# Patient Record
Sex: Female | Born: 1945 | Race: White | Hispanic: No | Marital: Married | Smoking: Former smoker
Health system: Southern US, Community
[De-identification: ages and names within clinical notes are randomized; demographics above are authoritative.]

## PROBLEM LIST (undated history)

## (undated) DIAGNOSIS — M1990 Unspecified osteoarthritis, unspecified site: Secondary | ICD-10-CM

## (undated) DIAGNOSIS — I10 Essential (primary) hypertension: Secondary | ICD-10-CM

## (undated) DIAGNOSIS — K219 Gastro-esophageal reflux disease without esophagitis: Secondary | ICD-10-CM

## (undated) HISTORY — DX: Essential (primary) hypertension: I10

## (undated) HISTORY — DX: Gastro-esophageal reflux disease without esophagitis: K21.9

## (undated) HISTORY — DX: Unspecified osteoarthritis, unspecified site: M19.90

---

## 2012-10-22 DIAGNOSIS — I10 Essential (primary) hypertension: Secondary | ICD-10-CM | POA: Insufficient documentation

## 2013-04-07 DIAGNOSIS — J449 Chronic obstructive pulmonary disease, unspecified: Secondary | ICD-10-CM | POA: Insufficient documentation

## 2013-10-31 DIAGNOSIS — E559 Vitamin D deficiency, unspecified: Secondary | ICD-10-CM | POA: Insufficient documentation

## 2013-11-28 DIAGNOSIS — K219 Gastro-esophageal reflux disease without esophagitis: Secondary | ICD-10-CM | POA: Diagnosis not present

## 2013-11-28 DIAGNOSIS — K921 Melena: Secondary | ICD-10-CM | POA: Diagnosis not present

## 2013-12-02 DIAGNOSIS — E785 Hyperlipidemia, unspecified: Secondary | ICD-10-CM | POA: Diagnosis not present

## 2013-12-02 DIAGNOSIS — E559 Vitamin D deficiency, unspecified: Secondary | ICD-10-CM | POA: Diagnosis not present

## 2013-12-02 DIAGNOSIS — J449 Chronic obstructive pulmonary disease, unspecified: Secondary | ICD-10-CM | POA: Diagnosis not present

## 2013-12-02 DIAGNOSIS — I10 Essential (primary) hypertension: Secondary | ICD-10-CM | POA: Diagnosis not present

## 2013-12-02 DIAGNOSIS — G4700 Insomnia, unspecified: Secondary | ICD-10-CM | POA: Diagnosis not present

## 2013-12-02 DIAGNOSIS — K219 Gastro-esophageal reflux disease without esophagitis: Secondary | ICD-10-CM | POA: Diagnosis not present

## 2014-01-24 DIAGNOSIS — R062 Wheezing: Secondary | ICD-10-CM | POA: Diagnosis not present

## 2014-01-24 DIAGNOSIS — J441 Chronic obstructive pulmonary disease with (acute) exacerbation: Secondary | ICD-10-CM | POA: Diagnosis not present

## 2014-01-24 DIAGNOSIS — G4733 Obstructive sleep apnea (adult) (pediatric): Secondary | ICD-10-CM | POA: Diagnosis not present

## 2014-01-24 DIAGNOSIS — R0602 Shortness of breath: Secondary | ICD-10-CM | POA: Diagnosis not present

## 2014-01-24 DIAGNOSIS — R05 Cough: Secondary | ICD-10-CM | POA: Diagnosis not present

## 2014-01-24 DIAGNOSIS — R942 Abnormal results of pulmonary function studies: Secondary | ICD-10-CM | POA: Diagnosis not present

## 2014-01-24 DIAGNOSIS — J309 Allergic rhinitis, unspecified: Secondary | ICD-10-CM | POA: Diagnosis not present

## 2014-01-24 DIAGNOSIS — R0789 Other chest pain: Secondary | ICD-10-CM | POA: Diagnosis not present

## 2014-01-24 DIAGNOSIS — R059 Cough, unspecified: Secondary | ICD-10-CM | POA: Diagnosis not present

## 2014-02-09 ENCOUNTER — Ambulatory Visit (INDEPENDENT_AMBULATORY_CARE_PROVIDER_SITE_OTHER): Payer: Medicare Other | Admitting: Sports Medicine

## 2014-02-09 ENCOUNTER — Encounter: Payer: Self-pay | Admitting: Sports Medicine

## 2014-02-09 ENCOUNTER — Ambulatory Visit (INDEPENDENT_AMBULATORY_CARE_PROVIDER_SITE_OTHER): Payer: Medicare Other

## 2014-02-09 VITALS — BP 131/77 | HR 69 | Ht 63.0 in | Wt 189.0 lb

## 2014-02-09 DIAGNOSIS — M1711 Unilateral primary osteoarthritis, right knee: Secondary | ICD-10-CM | POA: Insufficient documentation

## 2014-02-09 DIAGNOSIS — M25569 Pain in unspecified knee: Secondary | ICD-10-CM | POA: Diagnosis not present

## 2014-02-09 DIAGNOSIS — M25561 Pain in right knee: Secondary | ICD-10-CM

## 2014-02-09 DIAGNOSIS — M1710 Unilateral primary osteoarthritis, unspecified knee: Secondary | ICD-10-CM

## 2014-02-09 DIAGNOSIS — IMO0002 Reserved for concepts with insufficient information to code with codable children: Secondary | ICD-10-CM

## 2014-02-09 MED ORDER — MELOXICAM 15 MG PO TABS: ORAL_TABLET | ORAL | Status: DC

## 2014-02-09 NOTE — Assessment & Plan Note (Signed)
Likely related to osteoarthritis. Aspiration and injection as above. Mobic, x-rays, formal PT, return to see me in one month.

## 2014-02-09 NOTE — Progress Notes (Signed)
   Subjective:    I'm seeing this patient as a consultation for:  Dr. Jearld Lesch at [facility]  CC: Right knee pain  HPI: This is an extremely pleasant but nervous 68 year old female who comes in with a one-month history of worsening pain in her right knee, localized to both joint lines, with swelling, and instability, she also gets some popping but no catching or locking. She denies any trauma, pain came on indolently. No constitutional symptoms. Pain is severe, persistent.  Past medical history, Surgical history, Family history not pertinant except as noted below, Social history, Allergies, and medications have been entered into the medical record, reviewed, and no changes needed.   Review of Systems: No headache, visual changes, nausea, vomiting, diarrhea, constipation, dizziness, abdominal pain, skin rash, fevers, chills, night sweats, weight loss, swollen lymph nodes, body aches, joint swelling, muscle aches, chest pain, shortness of breath, mood changes, visual or auditory hallucinations.   Objective:   General: Well Developed, well nourished, and in no acute distress.  Neuro/Psych: Alert and oriented x3, extra-ocular muscles intact, able to move all 4 extremities, sensation grossly intact. Skin: Warm and dry, no rashes noted.  Respiratory: Not using accessory muscles, speaking in full sentences, trachea midline.  Cardiovascular: Pulses palpable, no extremity edema. Abdomen: Does not appear distended. Right Knee: Visible and palpable effusion with tenderness of both joint lines. ROM full in flexion and extension and lower leg rotation. Ligaments with solid consistent endpoints including ACL, PCL, LCL, MCL. Negative Mcmurray's, Apley's, and Thessalonian tests. Non painful patellar compression. Patellar glide without crepitus. Patellar and quadriceps tendons unremarkable. Hamstring and quadriceps strength is normal.   Procedure: Real-time Ultrasound Guided  aspiration Injection of right knee Device: GE Logiq E  Verbal informed consent obtained.  Time-out conducted.  Noted no overlying erythema, induration, or other signs of local infection.  Skin prepped in a sterile fashion.  Local anesthesia: Topical Ethyl chloride.  With sterile technique and under real time ultrasound guidance:  20-gauge needle advanced into suprapatellar recess, 20 cc of straw-colored fluid aspirated, syringe switched and then 2 cc Kenalog 40, 4 cc lidocaine injected easily Completed without difficulty  Pain immediately resolved suggesting accurate placement of the medication.  Advised to call if fevers/chills, erythema, induration, drainage, or persistent bleeding.  Images permanently stored and available for review in the ultrasound unit.  Impression: Technically successful ultrasound guided injection.  Impression and Recommendations:   This case required medical decision making of moderate complexity.

## 2014-03-10 DIAGNOSIS — M1710 Unilateral primary osteoarthritis, unspecified knee: Secondary | ICD-10-CM | POA: Diagnosis not present

## 2014-03-24 ENCOUNTER — Telehealth: Payer: Self-pay | Admitting: Sports Medicine

## 2014-03-24 ENCOUNTER — Encounter: Payer: Self-pay | Admitting: Sports Medicine

## 2014-03-24 NOTE — Telephone Encounter (Signed)
Patient called and request to know if she can have a letter written from you to take to court with her. She was out with her husband and the certain area was blocked off my police where you couldn't drive through which would of been too far for her to walk due to her knees and she didn't know her husband couldn't drop her off at that area which he did and then parked the car in the proper area and her husband received a $200.00 ticket just because he dropped her off in the front of the place they were attending and now has to go to court to prove why he had to drop her off. Please call if nay further questions needed in regard to the letter she needs. Patient states she will need the letter by Tuesday because she has to go to court Wednesday May 27th. Please call when her to pick up. PATIENT STATED THAT SHE HAS CALLED FOR TWO DAYS STRAIGHT AND LEFT A VOICEMAIL AND NEVER RECEIVED A CALL BACK UPSET. Thanks......[phone]

## 2014-03-24 NOTE — Telephone Encounter (Signed)
Letter is in my box 

## 2014-04-04 ENCOUNTER — Ambulatory Visit: Payer: Medicare Other | Admitting: Sports Medicine

## 2014-04-07 ENCOUNTER — Ambulatory Visit: Payer: Medicare Other | Admitting: Sports Medicine

## 2014-04-10 ENCOUNTER — Ambulatory Visit: Payer: Medicare Other | Admitting: Sports Medicine

## 2014-04-10 DIAGNOSIS — Z0289 Encounter for other administrative examinations: Secondary | ICD-10-CM

## 2014-04-23 DIAGNOSIS — R945 Abnormal results of liver function studies: Secondary | ICD-10-CM | POA: Diagnosis not present

## 2014-04-23 DIAGNOSIS — E872 Acidosis, unspecified: Secondary | ICD-10-CM | POA: Diagnosis not present

## 2014-04-23 DIAGNOSIS — A419 Sepsis, unspecified organism: Secondary | ICD-10-CM | POA: Diagnosis not present

## 2014-04-23 DIAGNOSIS — R1013 Epigastric pain: Secondary | ICD-10-CM | POA: Diagnosis not present

## 2014-04-23 DIAGNOSIS — R1084 Generalized abdominal pain: Secondary | ICD-10-CM | POA: Diagnosis not present

## 2014-04-23 DIAGNOSIS — I4460 Unspecified fascicular block: Secondary | ICD-10-CM | POA: Diagnosis not present

## 2014-04-23 DIAGNOSIS — K219 Gastro-esophageal reflux disease without esophagitis: Secondary | ICD-10-CM | POA: Diagnosis present

## 2014-04-23 DIAGNOSIS — E278 Other specified disorders of adrenal gland: Secondary | ICD-10-CM | POA: Diagnosis not present

## 2014-04-23 DIAGNOSIS — R933 Abnormal findings on diagnostic imaging of other parts of digestive tract: Secondary | ICD-10-CM | POA: Diagnosis not present

## 2014-04-23 DIAGNOSIS — G4733 Obstructive sleep apnea (adult) (pediatric): Secondary | ICD-10-CM | POA: Diagnosis not present

## 2014-04-23 DIAGNOSIS — K8309 Other cholangitis: Secondary | ICD-10-CM | POA: Diagnosis not present

## 2014-04-23 DIAGNOSIS — R059 Cough, unspecified: Secondary | ICD-10-CM | POA: Diagnosis not present

## 2014-04-23 DIAGNOSIS — R05 Cough: Secondary | ICD-10-CM | POA: Diagnosis not present

## 2014-04-23 DIAGNOSIS — D3500 Benign neoplasm of unspecified adrenal gland: Secondary | ICD-10-CM | POA: Diagnosis not present

## 2014-04-23 DIAGNOSIS — M353 Polymyalgia rheumatica: Secondary | ICD-10-CM | POA: Diagnosis present

## 2014-04-23 DIAGNOSIS — N390 Urinary tract infection, site not specified: Secondary | ICD-10-CM | POA: Diagnosis not present

## 2014-04-23 DIAGNOSIS — I4891 Unspecified atrial fibrillation: Secondary | ICD-10-CM | POA: Diagnosis not present

## 2014-04-23 DIAGNOSIS — I10 Essential (primary) hypertension: Secondary | ICD-10-CM | POA: Diagnosis not present

## 2014-04-23 DIAGNOSIS — E279 Disorder of adrenal gland, unspecified: Secondary | ICD-10-CM | POA: Diagnosis not present

## 2014-04-24 DIAGNOSIS — R945 Abnormal results of liver function studies: Secondary | ICD-10-CM | POA: Diagnosis not present

## 2014-04-25 DIAGNOSIS — R1013 Epigastric pain: Secondary | ICD-10-CM | POA: Insufficient documentation

## 2014-05-11 DIAGNOSIS — R109 Unspecified abdominal pain: Secondary | ICD-10-CM | POA: Diagnosis not present

## 2014-05-11 DIAGNOSIS — J449 Chronic obstructive pulmonary disease, unspecified: Secondary | ICD-10-CM | POA: Diagnosis not present

## 2014-05-11 DIAGNOSIS — Q441 Other congenital malformations of gallbladder: Secondary | ICD-10-CM | POA: Diagnosis not present

## 2014-05-11 DIAGNOSIS — K831 Obstruction of bile duct: Secondary | ICD-10-CM | POA: Diagnosis not present

## 2014-05-11 DIAGNOSIS — E278 Other specified disorders of adrenal gland: Secondary | ICD-10-CM | POA: Diagnosis not present

## 2014-05-11 DIAGNOSIS — I10 Essential (primary) hypertension: Secondary | ICD-10-CM | POA: Diagnosis not present

## 2014-05-11 DIAGNOSIS — K219 Gastro-esophageal reflux disease without esophagitis: Secondary | ICD-10-CM | POA: Diagnosis not present

## 2014-05-11 DIAGNOSIS — Z87891 Personal history of nicotine dependence: Secondary | ICD-10-CM | POA: Diagnosis not present

## 2014-07-03 DIAGNOSIS — L03039 Cellulitis of unspecified toe: Secondary | ICD-10-CM | POA: Diagnosis not present

## 2014-07-03 DIAGNOSIS — I10 Essential (primary) hypertension: Secondary | ICD-10-CM | POA: Diagnosis not present

## 2014-07-03 DIAGNOSIS — L600 Ingrowing nail: Secondary | ICD-10-CM | POA: Diagnosis not present

## 2014-07-21 DIAGNOSIS — L608 Other nail disorders: Secondary | ICD-10-CM | POA: Diagnosis not present

## 2014-07-21 DIAGNOSIS — L03039 Cellulitis of unspecified toe: Secondary | ICD-10-CM | POA: Diagnosis not present

## 2014-08-02 DIAGNOSIS — Z1239 Encounter for other screening for malignant neoplasm of breast: Secondary | ICD-10-CM | POA: Diagnosis not present

## 2014-08-02 DIAGNOSIS — Z79899 Other long term (current) drug therapy: Secondary | ICD-10-CM | POA: Diagnosis not present

## 2014-08-02 DIAGNOSIS — M899 Disorder of bone, unspecified: Secondary | ICD-10-CM | POA: Diagnosis not present

## 2014-08-02 DIAGNOSIS — E785 Hyperlipidemia, unspecified: Secondary | ICD-10-CM | POA: Diagnosis not present

## 2014-08-02 DIAGNOSIS — I10 Essential (primary) hypertension: Secondary | ICD-10-CM | POA: Diagnosis not present

## 2014-08-02 DIAGNOSIS — E559 Vitamin D deficiency, unspecified: Secondary | ICD-10-CM | POA: Diagnosis not present

## 2014-08-02 DIAGNOSIS — M949 Disorder of cartilage, unspecified: Secondary | ICD-10-CM | POA: Diagnosis not present

## 2014-08-02 DIAGNOSIS — Z23 Encounter for immunization: Secondary | ICD-10-CM | POA: Diagnosis not present

## 2014-08-18 DIAGNOSIS — M79609 Pain in unspecified limb: Secondary | ICD-10-CM | POA: Diagnosis not present

## 2014-08-18 DIAGNOSIS — L600 Ingrowing nail: Secondary | ICD-10-CM | POA: Diagnosis not present

## 2014-09-04 DIAGNOSIS — J441 Chronic obstructive pulmonary disease with (acute) exacerbation: Secondary | ICD-10-CM | POA: Diagnosis not present

## 2014-09-04 DIAGNOSIS — G4733 Obstructive sleep apnea (adult) (pediatric): Secondary | ICD-10-CM | POA: Diagnosis not present

## 2014-09-04 DIAGNOSIS — I4891 Unspecified atrial fibrillation: Secondary | ICD-10-CM | POA: Diagnosis not present

## 2014-09-21 DIAGNOSIS — R1013 Epigastric pain: Secondary | ICD-10-CM | POA: Diagnosis not present

## 2014-09-21 DIAGNOSIS — K319 Disease of stomach and duodenum, unspecified: Secondary | ICD-10-CM | POA: Diagnosis not present

## 2014-09-21 DIAGNOSIS — I10 Essential (primary) hypertension: Secondary | ICD-10-CM | POA: Diagnosis not present

## 2014-09-21 DIAGNOSIS — K219 Gastro-esophageal reflux disease without esophagitis: Secondary | ICD-10-CM | POA: Diagnosis not present

## 2014-09-21 DIAGNOSIS — Z87891 Personal history of nicotine dependence: Secondary | ICD-10-CM | POA: Diagnosis not present

## 2014-09-21 DIAGNOSIS — D3502 Benign neoplasm of left adrenal gland: Secondary | ICD-10-CM | POA: Diagnosis not present

## 2014-09-21 DIAGNOSIS — M353 Polymyalgia rheumatica: Secondary | ICD-10-CM | POA: Diagnosis not present

## 2014-09-21 DIAGNOSIS — J449 Chronic obstructive pulmonary disease, unspecified: Secondary | ICD-10-CM | POA: Diagnosis not present

## 2014-09-21 DIAGNOSIS — M4125 Other idiopathic scoliosis, thoracolumbar region: Secondary | ICD-10-CM | POA: Diagnosis not present

## 2014-09-25 ENCOUNTER — Ambulatory Visit (INDEPENDENT_AMBULATORY_CARE_PROVIDER_SITE_OTHER): Payer: Medicare Other | Admitting: Sports Medicine

## 2014-09-25 ENCOUNTER — Encounter: Payer: Self-pay | Admitting: Sports Medicine

## 2014-09-25 DIAGNOSIS — M1711 Unilateral primary osteoarthritis, right knee: Secondary | ICD-10-CM | POA: Diagnosis not present

## 2014-09-25 NOTE — Assessment & Plan Note (Signed)
Aspiration, steroid injection and Visco supplementation with Orthovisc. Return in one week for Orthovisc injection #2 of 4. Start formal physical therapy.

## 2014-09-25 NOTE — Progress Notes (Signed)
  Subjective:    CC: Right knee pain  HPI: This is a pleasant 68 year old female, she responded extremely well to an aspiration and injection in April of this year, she returns with recurrent swelling and pain. Moderate, persistent. Localized in the joint lines, under the patella and the suprapatellar recess. No mechanical symptoms.  Past medical history, Surgical history, Family history not pertinant except as noted below, Social history, Allergies, and medications have been entered into the medical record, reviewed, and no changes needed.   Review of Systems: No fevers, chills, night sweats, weight loss, chest pain, or shortness of breath.   Objective:    General: Well Developed, well nourished, and in no acute distress.  Neuro: Alert and oriented x3, extra-ocular muscles intact, sensation grossly intact.  HEENT: Normocephalic, atraumatic, pupils equal round reactive to light, neck supple, no masses, no lymphadenopathy, thyroid nonpalpable.  Skin: Warm and dry, no rashes. Cardiac: Regular rate and rhythm, no murmurs rubs or gallops, no lower extremity edema.  Respiratory: Clear to auscultation bilaterally. Not using accessory muscles, speaking in full sentences. Right Knee: Visible and palpable effusion with pain under the medial joint line and under the patellar facets. ROM normal in flexion and extension and lower leg rotation. Ligaments with solid consistent endpoints including ACL, PCL, LCL, MCL. Negative Mcmurray's and provocative meniscal tests. Non painful patellar compression. Patellar and quadriceps tendons unremarkable. Hamstring and quadriceps strength is normal.  Procedure: Real-time Ultrasound Guided aspiration/Injection of right knee Device: GE Logiq E  Verbal informed consent obtained.  Time-out conducted.  Noted no overlying erythema, induration, or other signs of local infection.  Skin prepped in a sterile fashion.  Local anesthesia: Topical Ethyl chloride.  With  sterile technique and under real time ultrasound guidance:  20 mL of straw-colored fluid aspirated, syringe switched and 2 mL kenalog 40, 4 mL lidocaine injected, syringe again switched and  30 mg/2 mL of OrthoVisc (sodium hyaluronate) in a prefilled syringe was injected easily into the knee through a 18-gauge needle. Completed without difficulty  Pain immediately resolved suggesting accurate placement of the medication.  Advised to call if fevers/chills, erythema, induration, drainage, or persistent bleeding.  Images permanently stored and available for review in the ultrasound unit.  Impression: Technically successful ultrasound guided injection.  Impression and Recommendations:

## 2014-10-03 ENCOUNTER — Telehealth: Payer: Self-pay

## 2014-10-04 ENCOUNTER — Ambulatory Visit: Payer: Medicare Other | Admitting: Physical Therapy

## 2014-10-04 NOTE — Telephone Encounter (Signed)
Error. Nyonna Hargrove,CMA  

## 2014-10-05 ENCOUNTER — Ambulatory Visit (INDEPENDENT_AMBULATORY_CARE_PROVIDER_SITE_OTHER): Payer: Medicare Other | Admitting: Sports Medicine

## 2014-10-05 ENCOUNTER — Encounter: Payer: Self-pay | Admitting: Sports Medicine

## 2014-10-05 VITALS — BP 190/100 | HR 83 | Wt 184.0 lb

## 2014-10-05 DIAGNOSIS — M1711 Unilateral primary osteoarthritis, right knee: Secondary | ICD-10-CM | POA: Diagnosis not present

## 2014-10-05 MED ORDER — ACETAMINOPHEN-CODEINE #4 300-60 MG PO TABS: 1.0000 | ORAL_TABLET | Freq: Three times a day (TID) | ORAL | Status: DC | PRN

## 2014-10-05 NOTE — Assessment & Plan Note (Addendum)
Steroid flare after the first injection. Orthovisc injection #2 of 4. Return in one week for #3 of 4. Tylenol No. 4 as needed for pain Return in a separate visit for orthotics.

## 2014-10-05 NOTE — Progress Notes (Signed)
  Procedure: Real-time Ultrasound Guided Injection of Right knee  Device: GE Logiq E  Verbal informed consent obtained.  Time-out conducted.  Noted no overlying erythema, induration, or other signs of local infection.  Skin prepped in a sterile fashion.  Local anesthesia: Topical Ethyl chloride.  With sterile technique and under real time ultrasound guidance: 20 mL straw-colored fluid aspirated, syringe switched and  30 mg/2 mL of OrthoVisc (sodium hyaluronate) in a prefilled syringe was injected easily into the knee through a 22-gauge needle. Completed without difficulty  Pain immediately resolved suggesting accurate placement of the medication.  Advised to call if fevers/chills, erythema, induration, drainage, or persistent bleeding.  Images permanently stored and available for review in the ultrasound unit.  Impression: Technically successful ultrasound guided injection.

## 2014-10-06 ENCOUNTER — Ambulatory Visit: Payer: Medicare Other | Admitting: Sports Medicine

## 2014-10-12 ENCOUNTER — Ambulatory Visit (INDEPENDENT_AMBULATORY_CARE_PROVIDER_SITE_OTHER): Payer: Medicare Other | Admitting: Sports Medicine

## 2014-10-12 ENCOUNTER — Telehealth: Payer: Self-pay | Admitting: Sports Medicine

## 2014-10-12 ENCOUNTER — Encounter: Payer: Self-pay | Admitting: Sports Medicine

## 2014-10-12 VITALS — BP 156/84 | HR 81 | Ht 63.0 in | Wt 184.0 lb

## 2014-10-12 DIAGNOSIS — M1711 Unilateral primary osteoarthritis, right knee: Secondary | ICD-10-CM | POA: Diagnosis not present

## 2014-10-12 MED ORDER — ACETAMINOPHEN-CODEINE #4 300-60 MG PO TABS: 1.0000 | ORAL_TABLET | Freq: Three times a day (TID) | ORAL | Status: DC | PRN

## 2014-10-12 MED ORDER — AMBULATORY NON FORMULARY MEDICATION: Status: DC

## 2014-10-12 NOTE — Telephone Encounter (Signed)
Re: Orthotics-no appts before Jan 15th Pt's husband said that Doctor told them that he  can refer his wife  to someone in [facility]  if you did not have any appts sooner. Thank you.

## 2014-10-12 NOTE — Telephone Encounter (Signed)
I would recommend Dr. Karlton Lemon for the orthotics.  He builds them the same way as I do, and if they cannot wait for a slot to open up for me for that long please see if he would be willing to make them.  I will cc him on this.  Dr. Barbaraann Barthel this is a pleasant patient that I am treating for knee OA, currently going thru visco.  I think she would benefit from orthotics.  Wondering if you could fit her in before I can?  I mentioned you to her in the office today.

## 2014-10-12 NOTE — Progress Notes (Signed)
  Procedure: Real-time Ultrasound Guided Injection of Right knee  Device: GE Logiq E  Verbal informed consent obtained.  Time-out conducted.  Noted no overlying erythema, induration, or other signs of local infection.  Skin prepped in a sterile fashion.  Local anesthesia: Topical Ethyl chloride.  With sterile technique and under real time ultrasound guidance: 10 mL straw-colored fluid aspirated, syringe switched and  30 mg/2 mL of OrthoVisc (sodium hyaluronate) in a prefilled syringe was injected easily into the knee through a 22-gauge needle. Completed without difficulty  Pain immediately resolved suggesting accurate placement of the medication.  Advised to call if fevers/chills, erythema, induration, drainage, or persistent bleeding.  Images permanently stored and available for review in the ultrasound unit.  Impression: Technically successful ultrasound guided injection.  I then took a great deal of time strapping on a compressive dressing and a knee brace to her right knee.

## 2014-10-12 NOTE — Assessment & Plan Note (Addendum)
Orthovisc injection #3 of 4 given today. Reaction knee brace given today. Prescription for walker and reprinting pain medication, patient says he lost the prescription, understands that we will not be providing any further refills of lost controlled substances.Marland Kitchen

## 2014-10-13 NOTE — Telephone Encounter (Signed)
Sounds good - Will forward to my staff to contact her for an appointment.  Thanks!

## 2014-10-13 NOTE — Telephone Encounter (Signed)
Patient on the schedule for Tuesday.

## 2014-10-17 ENCOUNTER — Ambulatory Visit (INDEPENDENT_AMBULATORY_CARE_PROVIDER_SITE_OTHER): Payer: Medicare Other | Admitting: Family Medicine

## 2014-10-17 VITALS — BP 130/83 | HR 83 | Ht 63.0 in | Wt 180.0 lb

## 2014-10-17 DIAGNOSIS — M1711 Unilateral primary osteoarthritis, right knee: Secondary | ICD-10-CM | POA: Diagnosis not present

## 2014-10-17 DIAGNOSIS — R269 Unspecified abnormalities of gait and mobility: Secondary | ICD-10-CM

## 2014-10-19 ENCOUNTER — Ambulatory Visit (INDEPENDENT_AMBULATORY_CARE_PROVIDER_SITE_OTHER): Payer: Medicare Other | Admitting: Sports Medicine

## 2014-10-19 ENCOUNTER — Encounter: Payer: Self-pay | Admitting: Sports Medicine

## 2014-10-19 ENCOUNTER — Ambulatory Visit: Payer: Medicare Other | Admitting: Sports Medicine

## 2014-10-19 DIAGNOSIS — R269 Unspecified abnormalities of gait and mobility: Secondary | ICD-10-CM | POA: Insufficient documentation

## 2014-10-19 DIAGNOSIS — I4891 Unspecified atrial fibrillation: Secondary | ICD-10-CM | POA: Insufficient documentation

## 2014-10-19 DIAGNOSIS — E785 Hyperlipidemia, unspecified: Secondary | ICD-10-CM | POA: Insufficient documentation

## 2014-10-19 DIAGNOSIS — K219 Gastro-esophageal reflux disease without esophagitis: Secondary | ICD-10-CM | POA: Insufficient documentation

## 2014-10-19 DIAGNOSIS — M1711 Unilateral primary osteoarthritis, right knee: Secondary | ICD-10-CM | POA: Diagnosis not present

## 2014-10-19 DIAGNOSIS — G4733 Obstructive sleep apnea (adult) (pediatric): Secondary | ICD-10-CM | POA: Insufficient documentation

## 2014-10-19 MED ORDER — DICLOFENAC SODIUM 2 % TD SOLN: 2.0000 | Freq: Two times a day (BID) | TRANSDERMAL | Status: DC

## 2014-10-19 MED ORDER — DICLOFENAC SODIUM 2 % TD SOLN: TRANSDERMAL | Status: DC

## 2014-10-19 NOTE — Addendum Note (Signed)
Addended by: Doree Albee on: 10/19/2014 04:32 PM   Modules accepted: Orders

## 2014-10-19 NOTE — Assessment & Plan Note (Signed)
Orthovisc injection #4 of 4 today. Continue Tylenol No. 4. Continue knee brace. She is now a candidate for surgical intervention, she would like to think about it over the next month. I am going to add Pennsaid topical.

## 2014-10-19 NOTE — Addendum Note (Signed)
Addended by: Doree Albee on: 10/19/2014 04:37 PM   Modules accepted: Orders, Medications

## 2014-10-19 NOTE — Progress Notes (Signed)
  Procedure: Real-time Ultrasound Guided Injection of Right knee  Device: GE Logiq E  Verbal informed consent obtained.  Time-out conducted.  Noted no overlying erythema, induration, or other signs of local infection.  Skin prepped in a sterile fashion.  Local anesthesia: Topical Ethyl chloride.  With sterile technique and under real time ultrasound guidance: 10 mL straw-colored fluid aspirated, syringe switched and  30 mg/2 mL of OrthoVisc (sodium hyaluronate) in a prefilled syringe was injected easily into the knee through a 22-gauge needle. Completed without difficulty  Pain immediately resolved suggesting accurate placement of the medication.  Advised to call if fevers/chills, erythema, induration, drainage, or persistent bleeding.  Images permanently stored and available for review in the ultrasound unit.  Impression: Technically successful ultrasound guided injection.

## 2014-10-19 NOTE — Progress Notes (Signed)
PCP: Jearld Lesch, DO  Subjective:   HPI: Patient is a 68 y.o. female here for custom orthotics.  Patient has been seeing Dr. Dianah Field at [facility] for her right knee osteoarthritis. Treatment has included cortisone injection, orthovisc series, knee brace, exercises, mobic. Orthotics recommended for additional cushion and arch support.  Past Medical History  Diagnosis Date  . Hypertension   . GERD (gastroesophageal reflux disease)   . Arthritis     Current Outpatient Prescriptions on File Prior to Visit  Medication Sig Dispense Refill  . acetaminophen-codeine (TYLENOL #4) 300-60 MG per tablet Take 1 tablet by mouth every 8 (eight) hours as needed for moderate pain. 30 tablet 0  . AMBULATORY NON FORMULARY MEDICATION Four point walker 1 each 0  . atenolol (TENORMIN) 25 MG tablet Take 25 mg by mouth daily.    . hydrochlorothiazide (HYDRODIURIL) 25 MG tablet Take 25 mg by mouth daily.    . meloxicam (MOBIC) 15 MG tablet One tab PO qAM with breakfast for 2 weeks, then daily prn pain. 30 tablet 3  . pantoprazole (PROTONIX) 40 MG tablet Take 40 mg by mouth daily.     No current facility-administered medications on file prior to visit.    No past surgical history on file.  Allergies  Allergen Reactions  . Ace Inhibitors     Other reaction(s): Cough  . Amlodipine Cough    History   Social History  . Marital Status: Married    Spouse Name: N/A    Number of Children: N/A  . Years of Education: N/A   Occupational History  . Not on file.   Social History Main Topics  . Smoking status: Unknown If Ever Smoked  . Smokeless tobacco: Not on file  . Alcohol Use: Not on file  . Drug Use: Not on file  . Sexual Activity: Not on file   Other Topics Concern  . Not on file   Social History Narrative    No family history on file.  BP 130/83 mmHg  Pulse 83  Ht 5\' 3"  (1.6 m)  Wt 180 lb (81.647 kg)  BMI 31.89 kg/m2  Review of Systems: See HPI above.    Objective:   Physical Exam:  Gen: NAD  Bilateral feet/ankles: Mod overpronation.  No forefoot callus formation. No hallux valgus or rigidus. No TTP either feet or ankles. No leg length inequality.    Assessment & Plan:  1. Right knee arthritis - continue treatment with Dr. Dianah Field.  Custom orthotics made today as well - she did not have shoes to fit these into however and plans to bring these with her to buy new shoes.  Call us for reevaluation if any issues with them. Patient was fitted for a : standard, cushioned, semi-rigid orthotic. The orthotic was heated and afterward the patient stood on the orthotic blank positioned on the orthotic stand. The patient was positioned in subtalar neutral position and 10 degrees of ankle dorsiflexion in a weight bearing stance. After completion of molding, a stable base was applied to the orthotic blank. The blank was ground to a stable position for weight bearing. Size: 8 Base: blue med density eva Posting: none Additional orthotic padding: none Total prep time 45 minutes.

## 2014-10-19 NOTE — Assessment & Plan Note (Signed)
continue treatment with Dr. Dianah Field.  Custom orthotics made today as well - she did not have shoes to fit these into however and plans to bring these with her to buy new shoes.  Call us for reevaluation if any issues with them. Patient was fitted for a : standard, cushioned, semi-rigid orthotic. The orthotic was heated and afterward the patient stood on the orthotic blank positioned on the orthotic stand. The patient was positioned in subtalar neutral position and 10 degrees of ankle dorsiflexion in a weight bearing stance. After completion of molding, a stable base was applied to the orthotic blank. The blank was ground to a stable position for weight bearing. Size: 8 Base: blue med density eva Posting: none Additional orthotic padding: none Total prep time 45 minutes.

## 2014-10-25 DIAGNOSIS — R945 Abnormal results of liver function studies: Secondary | ICD-10-CM | POA: Diagnosis not present

## 2014-10-25 DIAGNOSIS — M353 Polymyalgia rheumatica: Secondary | ICD-10-CM | POA: Diagnosis not present

## 2014-10-25 DIAGNOSIS — K831 Obstruction of bile duct: Secondary | ICD-10-CM | POA: Diagnosis not present

## 2014-10-25 DIAGNOSIS — J449 Chronic obstructive pulmonary disease, unspecified: Secondary | ICD-10-CM | POA: Diagnosis not present

## 2014-10-25 DIAGNOSIS — K219 Gastro-esophageal reflux disease without esophagitis: Secondary | ICD-10-CM | POA: Diagnosis not present

## 2014-10-25 DIAGNOSIS — I10 Essential (primary) hypertension: Secondary | ICD-10-CM | POA: Diagnosis not present

## 2014-10-25 DIAGNOSIS — Z87891 Personal history of nicotine dependence: Secondary | ICD-10-CM | POA: Diagnosis not present

## 2014-10-25 DIAGNOSIS — R932 Abnormal findings on diagnostic imaging of liver and biliary tract: Secondary | ICD-10-CM | POA: Diagnosis not present

## 2014-10-25 DIAGNOSIS — K839 Disease of biliary tract, unspecified: Secondary | ICD-10-CM | POA: Diagnosis not present

## 2014-10-25 DIAGNOSIS — K838 Other specified diseases of biliary tract: Secondary | ICD-10-CM | POA: Diagnosis not present

## 2014-11-15 ENCOUNTER — Other Ambulatory Visit: Payer: Self-pay | Admitting: Sports Medicine

## 2014-11-15 ENCOUNTER — Encounter: Payer: Medicare Other | Admitting: Sports Medicine

## 2014-11-21 DIAGNOSIS — M79604 Pain in right leg: Secondary | ICD-10-CM | POA: Diagnosis not present

## 2014-11-21 DIAGNOSIS — R0789 Other chest pain: Secondary | ICD-10-CM | POA: Diagnosis not present

## 2014-11-21 DIAGNOSIS — G4733 Obstructive sleep apnea (adult) (pediatric): Secondary | ICD-10-CM | POA: Diagnosis not present

## 2014-11-21 DIAGNOSIS — J9811 Atelectasis: Secondary | ICD-10-CM | POA: Diagnosis not present

## 2014-11-21 DIAGNOSIS — I8001 Phlebitis and thrombophlebitis of superficial vessels of right lower extremity: Secondary | ICD-10-CM | POA: Diagnosis not present

## 2014-11-21 DIAGNOSIS — R079 Chest pain, unspecified: Secondary | ICD-10-CM | POA: Diagnosis not present

## 2014-11-21 DIAGNOSIS — M25561 Pain in right knee: Secondary | ICD-10-CM | POA: Diagnosis not present

## 2014-11-22 DIAGNOSIS — J449 Chronic obstructive pulmonary disease, unspecified: Secondary | ICD-10-CM | POA: Diagnosis not present

## 2014-11-22 DIAGNOSIS — G4733 Obstructive sleep apnea (adult) (pediatric): Secondary | ICD-10-CM | POA: Diagnosis not present

## 2014-11-22 DIAGNOSIS — J9811 Atelectasis: Secondary | ICD-10-CM | POA: Diagnosis not present

## 2014-11-27 ENCOUNTER — Telehealth: Payer: Self-pay | Admitting: Emergency Medicine

## 2014-11-27 DIAGNOSIS — M1710 Unilateral primary osteoarthritis, unspecified knee: Secondary | ICD-10-CM

## 2014-11-27 NOTE — Telephone Encounter (Signed)
Patient called to say she wants to go to surgeon, as you suggested last visit 10-20-15; she has selected Dr.Donald Albright in [place]; [phone]; would prefer an afternoon appointment.

## 2014-11-27 NOTE — Telephone Encounter (Signed)
Referral placed.

## 2014-11-28 ENCOUNTER — Telehealth: Payer: Self-pay

## 2014-11-28 NOTE — Telephone Encounter (Signed)
Patient called in regards to her referral for Dr. Riki Sheer. The order is already in please call patient with update. [name],CMA

## 2014-12-19 DIAGNOSIS — E669 Obesity, unspecified: Secondary | ICD-10-CM | POA: Diagnosis not present

## 2014-12-19 DIAGNOSIS — M170 Bilateral primary osteoarthritis of knee: Secondary | ICD-10-CM | POA: Diagnosis not present

## 2014-12-21 DIAGNOSIS — M25561 Pain in right knee: Secondary | ICD-10-CM | POA: Diagnosis not present

## 2014-12-21 DIAGNOSIS — R260 Ataxic gait: Secondary | ICD-10-CM | POA: Diagnosis not present

## 2014-12-22 DIAGNOSIS — R260 Ataxic gait: Secondary | ICD-10-CM | POA: Diagnosis not present

## 2014-12-22 DIAGNOSIS — M25561 Pain in right knee: Secondary | ICD-10-CM | POA: Diagnosis not present

## 2014-12-25 DIAGNOSIS — M25561 Pain in right knee: Secondary | ICD-10-CM | POA: Diagnosis not present

## 2014-12-25 DIAGNOSIS — R260 Ataxic gait: Secondary | ICD-10-CM | POA: Diagnosis not present

## 2015-01-03 DIAGNOSIS — M25561 Pain in right knee: Secondary | ICD-10-CM | POA: Diagnosis not present

## 2015-01-03 DIAGNOSIS — R260 Ataxic gait: Secondary | ICD-10-CM | POA: Diagnosis not present

## 2015-01-05 DIAGNOSIS — R260 Ataxic gait: Secondary | ICD-10-CM | POA: Diagnosis not present

## 2015-01-05 DIAGNOSIS — M25561 Pain in right knee: Secondary | ICD-10-CM | POA: Diagnosis not present

## 2015-01-08 DIAGNOSIS — R260 Ataxic gait: Secondary | ICD-10-CM | POA: Diagnosis not present

## 2015-01-08 DIAGNOSIS — M25561 Pain in right knee: Secondary | ICD-10-CM | POA: Diagnosis not present

## 2015-01-10 DIAGNOSIS — M25561 Pain in right knee: Secondary | ICD-10-CM | POA: Diagnosis not present

## 2015-01-10 DIAGNOSIS — R260 Ataxic gait: Secondary | ICD-10-CM | POA: Diagnosis not present

## 2015-01-16 DIAGNOSIS — R260 Ataxic gait: Secondary | ICD-10-CM | POA: Diagnosis not present

## 2015-01-16 DIAGNOSIS — M25561 Pain in right knee: Secondary | ICD-10-CM | POA: Diagnosis not present

## 2015-01-17 DIAGNOSIS — M25561 Pain in right knee: Secondary | ICD-10-CM | POA: Diagnosis not present

## 2015-01-17 DIAGNOSIS — R260 Ataxic gait: Secondary | ICD-10-CM | POA: Diagnosis not present

## 2015-01-19 DIAGNOSIS — M25561 Pain in right knee: Secondary | ICD-10-CM | POA: Diagnosis not present

## 2015-01-19 DIAGNOSIS — R260 Ataxic gait: Secondary | ICD-10-CM | POA: Diagnosis not present

## 2015-01-23 DIAGNOSIS — M7121 Synovial cyst of popliteal space [Baker], right knee: Secondary | ICD-10-CM | POA: Diagnosis not present

## 2015-01-23 DIAGNOSIS — M25461 Effusion, right knee: Secondary | ICD-10-CM | POA: Diagnosis not present

## 2015-01-23 DIAGNOSIS — S83271A Complex tear of lateral meniscus, current injury, right knee, initial encounter: Secondary | ICD-10-CM | POA: Diagnosis not present

## 2015-01-30 DIAGNOSIS — M170 Bilateral primary osteoarthritis of knee: Secondary | ICD-10-CM | POA: Diagnosis not present

## 2015-02-12 DIAGNOSIS — J441 Chronic obstructive pulmonary disease with (acute) exacerbation: Secondary | ICD-10-CM | POA: Diagnosis not present

## 2015-02-12 DIAGNOSIS — R05 Cough: Secondary | ICD-10-CM | POA: Diagnosis not present

## 2015-02-12 DIAGNOSIS — G4733 Obstructive sleep apnea (adult) (pediatric): Secondary | ICD-10-CM | POA: Diagnosis not present

## 2015-05-23 DIAGNOSIS — M5136 Other intervertebral disc degeneration, lumbar region: Secondary | ICD-10-CM | POA: Diagnosis not present

## 2015-05-23 DIAGNOSIS — M545 Low back pain: Secondary | ICD-10-CM | POA: Diagnosis not present

## 2015-05-23 DIAGNOSIS — L03019 Cellulitis of unspecified finger: Secondary | ICD-10-CM | POA: Diagnosis not present

## 2015-06-04 DIAGNOSIS — H2510 Age-related nuclear cataract, unspecified eye: Secondary | ICD-10-CM | POA: Diagnosis not present

## 2015-06-04 DIAGNOSIS — H5200 Hypermetropia, unspecified eye: Secondary | ICD-10-CM | POA: Diagnosis not present

## 2015-06-04 DIAGNOSIS — H11153 Pinguecula, bilateral: Secondary | ICD-10-CM | POA: Diagnosis not present

## 2015-06-04 DIAGNOSIS — H35033 Hypertensive retinopathy, bilateral: Secondary | ICD-10-CM | POA: Diagnosis not present

## 2015-06-04 DIAGNOSIS — H52223 Regular astigmatism, bilateral: Secondary | ICD-10-CM | POA: Diagnosis not present

## 2015-06-04 DIAGNOSIS — H524 Presbyopia: Secondary | ICD-10-CM | POA: Diagnosis not present

## 2015-06-16 IMAGING — CR DG KNEE 1-2V*L*
4 series · 4 of 4 positions shown · non-contrast
Comparison: None.

CLINICAL DATA: Pain.

EXAM:
LEFT KNEE - 1-2 VIEW

[view not recorded (1 of 4)]
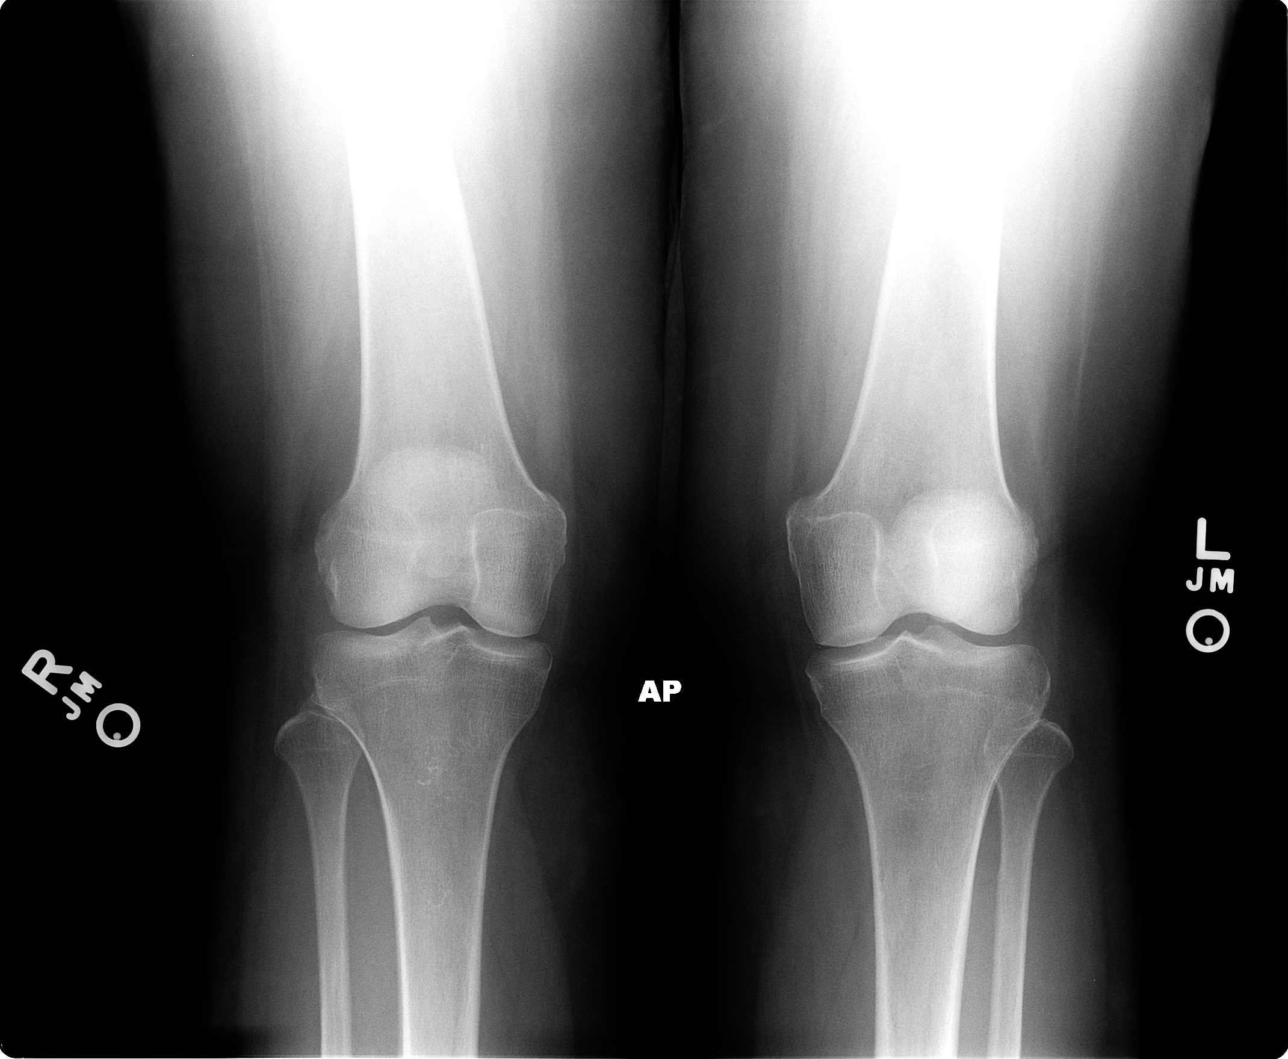

[view not recorded (2 of 4)]
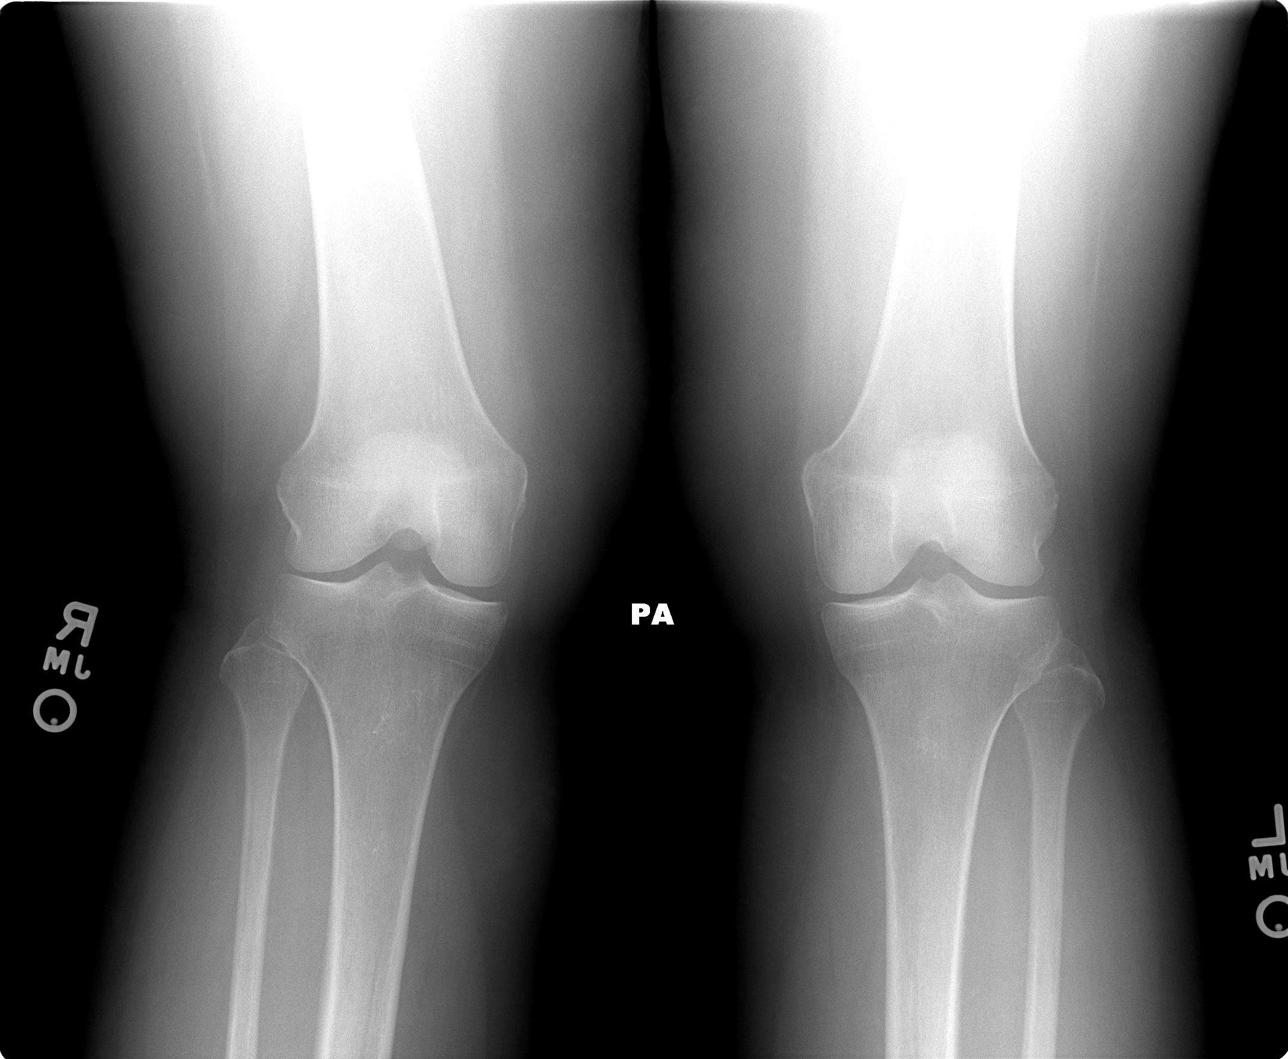

[view not recorded (3 of 4)]
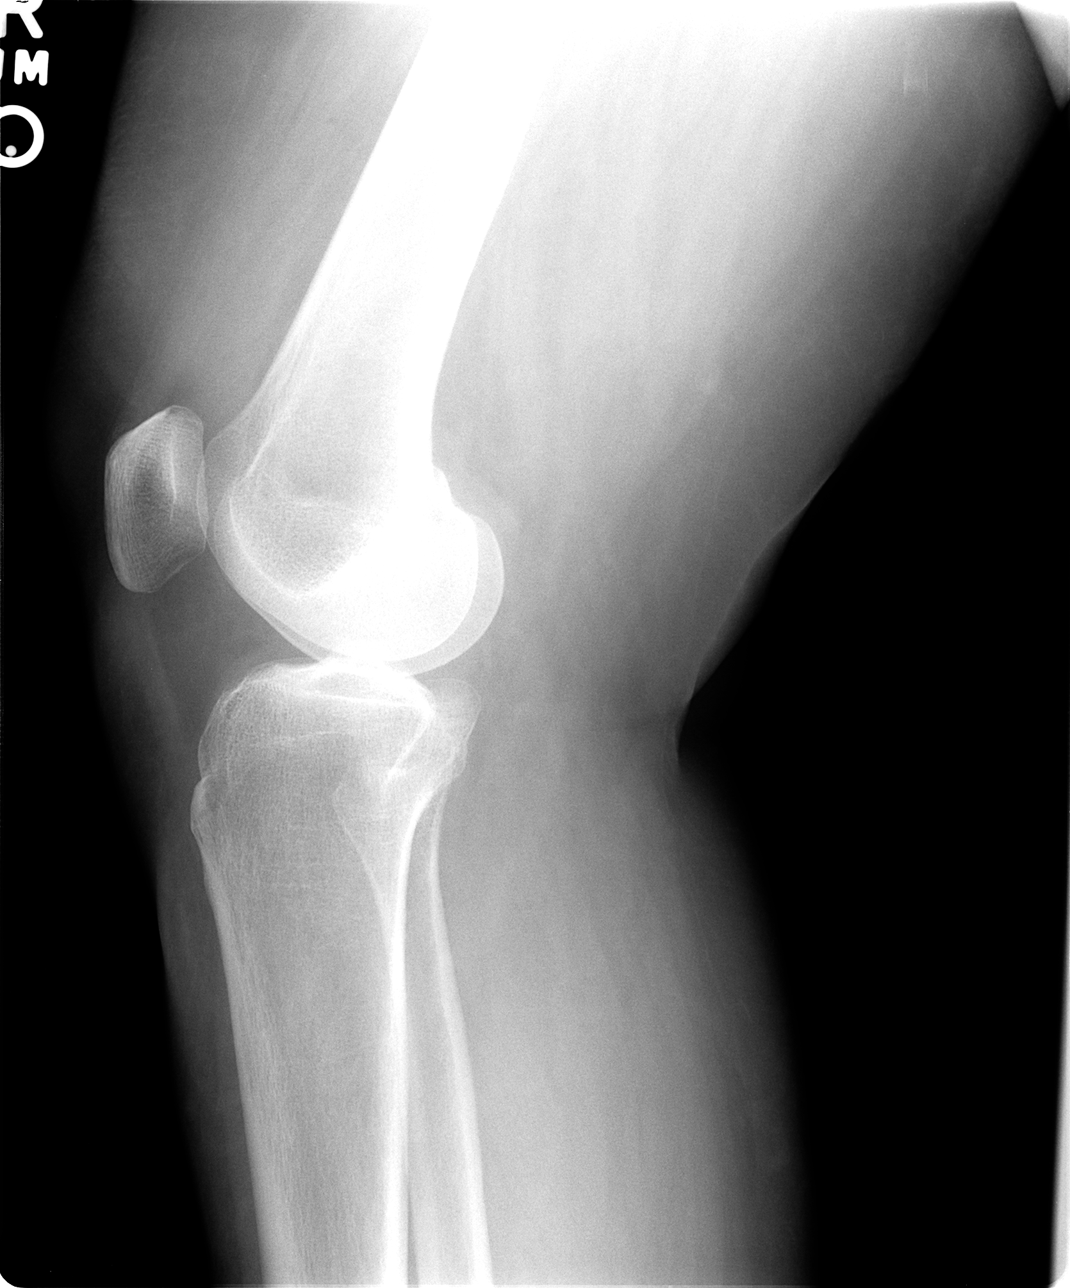

[view not recorded (4 of 4)]
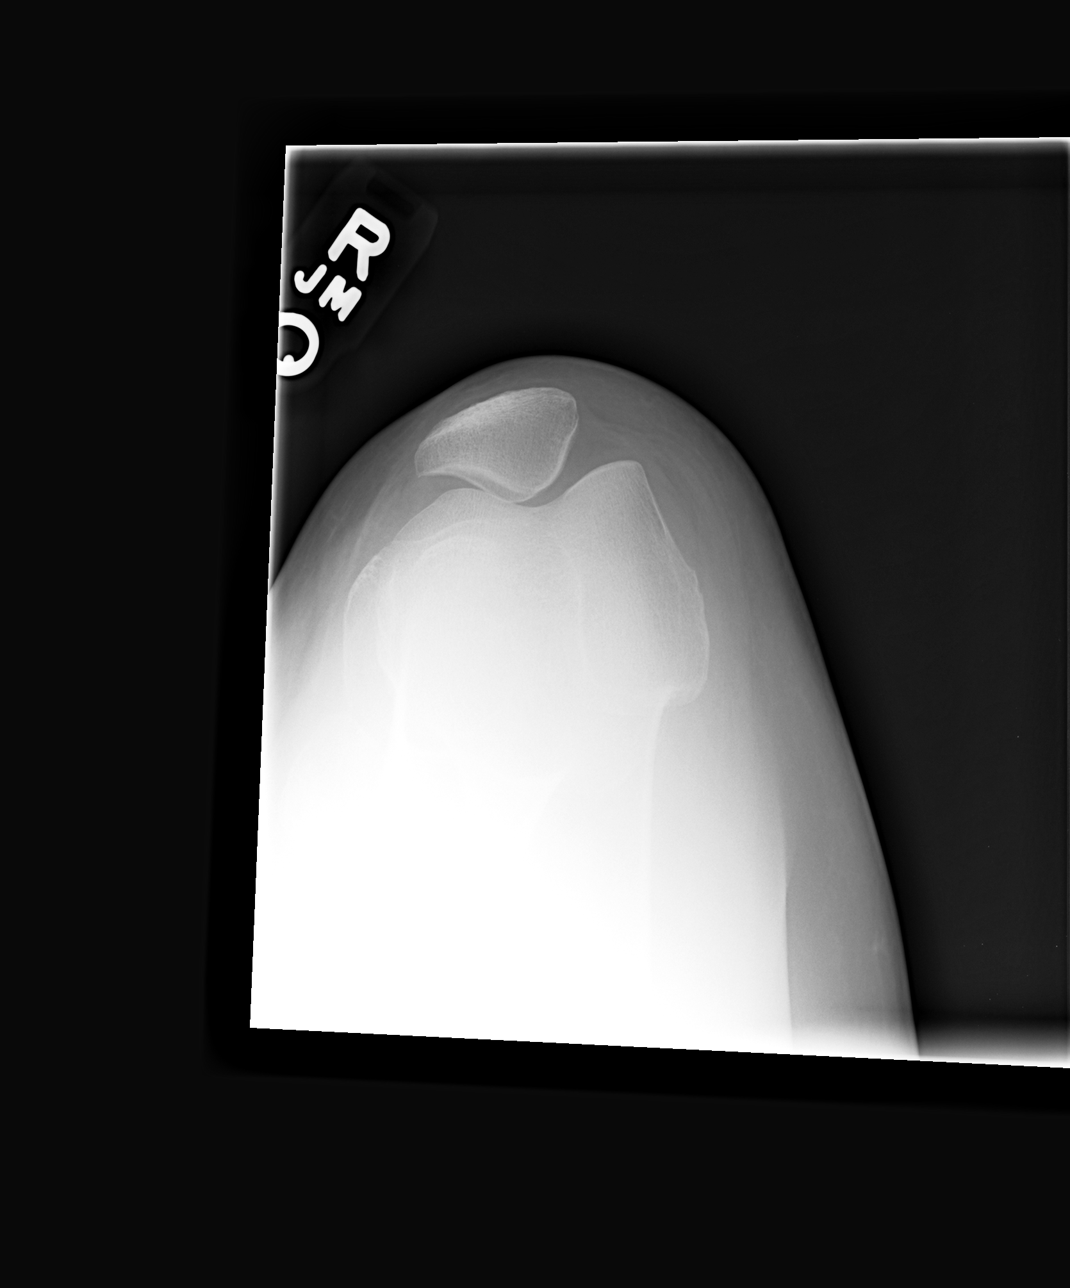

[4 of 4 positions shown; findings below may reference images not displayed]

FINDINGS: There is no evidence of fracture, dislocation, or joint effusion.
Mild degenerative changes present. Joint space loss is most
prominent about the lateral compartment. There is no evidence of
erosive arthropathy or other focal bone abnormality. Soft tissues
are unremarkable.
IMPRESSION: Mild degenerative change with joint space loss of the lateral
compartment. No acute abnormality.

## 2015-07-07 ENCOUNTER — Encounter: Payer: Self-pay | Admitting: Emergency Medicine

## 2015-07-07 ENCOUNTER — Emergency Department (INDEPENDENT_AMBULATORY_CARE_PROVIDER_SITE_OTHER)
Admission: EM | Admit: 2015-07-07 | Discharge: 2015-07-07 | Disposition: A | Discharge status: Home / Self Care | Payer: Medicare Other | Source: Home / Self Care | Attending: Family Medicine | Admitting: Family Medicine

## 2015-07-07 DIAGNOSIS — H109 Unspecified conjunctivitis: Secondary | ICD-10-CM

## 2015-07-07 MED ORDER — TOBRAMYCIN 0.3 % OP SOLN: 1.0000 [drp] | OPHTHALMIC | Status: DC

## 2015-07-07 MED ORDER — CARBOXYMETHYLCELLULOSE SODIUM 1 % OP SOLN: 1.0000 [drp] | Freq: Three times a day (TID) | OPHTHALMIC | Status: DC

## 2015-07-07 NOTE — ED Notes (Signed)
Called pt rx to pharm per her request.

## 2015-07-07 NOTE — Discharge Instructions (Signed)
Conjunctivitis Conjunctivitis is commonly called "pink eye." Conjunctivitis can be caused by bacterial or viral infection, allergies, or injuries. There is usually redness of the lining of the eye, itching, discomfort, and sometimes discharge. There may be deposits of matter along the eyelids. A viral infection usually causes a watery discharge, while a bacterial infection causes a yellowish, thick discharge. Pink eye is very contagious and spreads by direct contact. You may be given antibiotic eyedrops as part of your treatment. Before using your eye medicine, remove all drainage from the eye by washing gently with warm water and cotton balls. Continue to use the medication until you have awakened 2 mornings in a row without discharge from the eye. Do not rub your eye. This increases the irritation and helps spread infection. Use separate towels from other household members. Wash your hands with soap and water before and after touching your eyes. Use cold compresses to reduce pain and sunglasses to relieve irritation from light. Do not wear contact lenses or wear eye makeup until the infection is gone. SEEK MEDICAL CARE IF:   Your symptoms are not better after 3 days of treatment.  You have increased pain or trouble seeing.  The outer eyelids become very red or swollen. Document Released: 11/27/2004 Document Revised: 01/12/2012 Document Reviewed: 10/20/2005 [facility] Patient Information 2015 [place], [place]. This information is not intended to replace advice given to you by your health care provider. Make sure you discuss any questions you have with your health care provider.  Blepharitis Blepharitis is a skin problem that makes your eyelids watery, red, puffy (swollen), crusty, scaly, or painful. It may also make your eyes itch. You may lose eyelashes. HOME CARE  Keep your hands clean.  Use a clean towel each time you dry your eyelids. Do not share towels or makeup with anyone.  Carefully wash  your eyelids and eyelashes 2 times a day. Use warm water and baby shampoo or just water.  Wash your face and eyebrows at least once a day.  Hold a folded washcloth under warm water. Squeeze the water out. Put the warm washcloth on your eyes 2 times a day for 10 minutes, or as told by your doctor.  Apply medicated cream as told by your doctor.  Avoid rubbing your eyes.  Avoid wearing makeup until you get better.  Follow up with your doctor as told. GET HELP RIGHT AWAY IF:  Your pain, redness, or puffiness gets worse.  Your pain, redness, or puffiness spreads to other parts of your face.  Your vision changes, or you have pain when looking at lights or moving objects.  You have a fever.  You do not get better after 2 to 4 days. MAKE SURE YOU:  Understand these instructions.  Will watch your condition.  Will get help right away if you are not doing well or get worse. Document Released: 07/29/2008 Document Revised: 01/12/2012 Document Reviewed: 11/27/2010 [facility] Patient Information 2015 [place], [place]. This information is not intended to replace advice given to you by your health care provider. Make sure you discuss any questions you have with your health care provider.

## 2015-07-07 NOTE — ED Notes (Signed)
Pt c/o right eye itching and drainage x6 days.

## 2015-07-07 NOTE — ED Provider Notes (Signed)
CSN: [id number]     Arrival date & time 07/07/15  83 History   First MD Initiated Contact with Patient 07/07/15 1638     Chief Complaint  Patient presents with  . Eye Problem   (Consider location/radiation/quality/duration/timing/severity/associated sxs/prior Treatment) HPI Pt is a 69yo female presenting to [facility] with c/o 6 day hx of Right eye erythema, itching and yellow discharge. Pain is 1/10 described as a "discomfort" denies recent illness of cough, congestion, or sore throat. Denies sick contacts or contact with new soaps, lotions or medications. 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 Denies trauma to eye. Denies fever or chills.  Past Medical History  Diagnosis Date  . Hypertension   . GERD (gastroesophageal reflux disease)   . Arthritis    History reviewed. No pertinent past surgical history. History reviewed. No pertinent family history. Social History  Substance Use Topics  . Smoking status: Never Smoker   . Smokeless tobacco: None  . Alcohol Use: No   OB History    No data available     Review of Systems  Constitutional: Negative for fever and chills.  HENT: Negative for congestion and ear pain.   Eyes: Positive for pain, discharge, redness and itching. Negative for photophobia and visual disturbance.  Respiratory: Negative for cough.   Neurological: Negative for dizziness, light-headedness and headaches.    Allergies  Ace inhibitors and Amlodipine  Home Medications   Prior to Admission medications   Medication Sig Start Date End Date Taking? Authorizing Provider  acetaminophen-codeine (TYLENOL #4) 300-60 MG per tablet TAKE ONE TABLET BY MOUTH EVERY 8 HOURS AS NEEDED FOR MODERATE PAIN 11/15/14   Silverio Decamp, MD  AMBULATORY NON FORMULARY MEDICATION Four point walker 10/12/14   Silverio Decamp, MD  atenolol (TENORMIN) 25 MG tablet Take 25 mg by mouth daily.    Historical Provider, MD   carboxymethylcellulose 1 % ophthalmic solution Apply 1 drop to eye 3 ([facility]) times daily. For 7 days, keep refrigerated 07/07/15   Noland Fordyce, PA-C  Diclofenac Sodium 2 % SOLN Place 2 sprays onto the skin 2 (two) times daily. 10/19/14   Silverio Decamp, MD  hydrochlorothiazide (HYDRODIURIL) 25 MG tablet Take 25 mg by mouth daily.    Historical Provider, MD  meloxicam (MOBIC) 15 MG tablet One tab PO qAM with breakfast for 2 weeks, then daily prn pain. 02/09/14   Silverio Decamp, MD  pantoprazole (PROTONIX) 40 MG tablet Take 40 mg by mouth daily.    Historical Provider, MD  tobramycin (TOBREX) 0.3 % ophthalmic solution Place 1 drop into the right eye every 4 (four) hours. For 5 days 07/07/15   Noland Fordyce, PA-C   Meds Ordered and Administered this Visit  Medications - No data to display  BP 127/81 mmHg  Pulse 78  Temp(Src) 98.8 F (37.1 C) (Oral)  SpO2 96% No data found.   Physical Exam  Constitutional: She is oriented to person, place, and time. She appears well-developed and well-nourished.  HENT:  Head: Normocephalic and atraumatic.  Nose: Nose normal.  Mouth/Throat: Uvula is midline, oropharynx is clear and moist and mucous membranes are normal.  Eyes: EOM are normal. Pupils are equal, round, and reactive to light. Right eye exhibits discharge ( scant yellow crusting discharge). Right eye exhibits no exudate. No foreign body present in the right eye. Right conjunctiva is injected. No scleral icterus.  Right eye: erythema and dried skin  on upper and lower eye lid. Tiny cracks in skin from dryness. Cornea is injected. Scant yellow discharge. Mild tenderness to upper eyelid. No edema.  Neck: Normal range of motion.  Cardiovascular: Normal rate.   Pulmonary/Chest: Effort normal.  Musculoskeletal: Normal range of motion.  Neurological: She is alert and oriented to person, place, and time.  Skin: Skin is warm and dry.  Psychiatric: She has a normal mood and affect. Her  behavior is normal.  Nursing note and vitals reviewed.   ED Course  Procedures (including critical care time)  Labs Review Labs Reviewed - No data to display  Imaging Review No results found.   MDM   1. Conjunctivitis of right eye     Right eye concerning for allergic conjunctivitis with secondary bacterial conjunctivitis given duration of symptoms.  Rx: tobramycin ophthalmic solution and Refresh ophthalmic solution. Advised to f/u with PCP in 3-4 days for recheck of symptoms if not improving, sooner if worsening. Patient verbalized understanding and agreement with treatment plan.     Noland Fordyce, PA-C 07/07/15 [phone]

## 2015-08-02 DIAGNOSIS — B349 Viral infection, unspecified: Secondary | ICD-10-CM | POA: Diagnosis not present

## 2015-08-02 DIAGNOSIS — R05 Cough: Secondary | ICD-10-CM | POA: Diagnosis not present

## 2015-08-16 DIAGNOSIS — H6981 Other specified disorders of Eustachian tube, right ear: Secondary | ICD-10-CM | POA: Diagnosis not present

## 2015-08-16 DIAGNOSIS — H903 Sensorineural hearing loss, bilateral: Secondary | ICD-10-CM | POA: Diagnosis not present

## 2015-08-16 DIAGNOSIS — R220 Localized swelling, mass and lump, head: Secondary | ICD-10-CM | POA: Diagnosis not present

## 2015-10-16 ENCOUNTER — Ambulatory Visit (INDEPENDENT_AMBULATORY_CARE_PROVIDER_SITE_OTHER): Payer: Medicare Other

## 2015-10-16 ENCOUNTER — Encounter: Payer: Self-pay | Admitting: Sports Medicine

## 2015-10-16 ENCOUNTER — Ambulatory Visit (INDEPENDENT_AMBULATORY_CARE_PROVIDER_SITE_OTHER): Payer: Medicare Other | Admitting: Sports Medicine

## 2015-10-16 VITALS — BP 139/81 | HR 85 | Temp 98.1°F | Resp 18 | Wt 180.0 lb

## 2015-10-16 DIAGNOSIS — M533 Sacrococcygeal disorders, not elsewhere classified: Secondary | ICD-10-CM

## 2015-10-16 DIAGNOSIS — M4185 Other forms of scoliosis, thoracolumbar region: Secondary | ICD-10-CM | POA: Diagnosis not present

## 2015-10-16 DIAGNOSIS — M1711 Unilateral primary osteoarthritis, right knee: Secondary | ICD-10-CM | POA: Diagnosis not present

## 2015-10-16 DIAGNOSIS — M545 Low back pain, unspecified: Secondary | ICD-10-CM

## 2015-10-16 DIAGNOSIS — M5136 Other intervertebral disc degeneration, lumbar region: Secondary | ICD-10-CM | POA: Diagnosis not present

## 2015-10-16 MED ORDER — MELOXICAM 15 MG PO TABS: ORAL_TABLET | ORAL | Status: DC

## 2015-10-16 MED ORDER — LOSARTAN POTASSIUM-HCTZ 100-25 MG PO TABS: ORAL_TABLET | ORAL | Status: DC

## 2015-10-16 MED ORDER — METOPROLOL SUCCINATE ER 25 MG PO TB24: ORAL_TABLET | ORAL | Status: DC

## 2015-10-16 MED ORDER — PREDNISONE 50 MG PO TABS
ORAL_TABLET | ORAL | Status: DC
Start: 2015-10-16 — End: 2016-04-28

## 2015-10-16 NOTE — Assessment & Plan Note (Signed)
Overall doing okay after viscous supplementation one year ago.

## 2015-10-16 NOTE — Progress Notes (Signed)
  Subjective:    CC: Back pain  HPI: This is a pleasant 69 year old female with a several week history of severe pain that she localizes in the bilateral low back, worse with standing, rotation. She also has the pain that she localizes at the sacroiliac joints with radiation into the buttock but not past the knees. Pain is moderate, persistent, no constitutional symptoms, no bowel or bladder dysfunction, no saddle numbness.  Past medical history, Surgical history, Family history not pertinant except as noted below, Social history, Allergies, and medications have been entered into the medical record, reviewed, and no changes needed.   Review of Systems: No fevers, chills, night sweats, weight loss, chest pain, or shortness of breath.   Objective:    General: Well Developed, well nourished, and in no acute distress.  Neuro: Alert and oriented x3, extra-ocular muscles intact, sensation grossly intact.  HEENT: Normocephalic, atraumatic, pupils equal round reactive to light, neck supple, no masses, no lymphadenopathy, thyroid nonpalpable.  Skin: Warm and dry, no rashes. Cardiac: Regular rate and rhythm, no murmurs rubs or gallops, no lower extremity edema.  Respiratory: Clear to auscultation bilaterally. Not using accessory muscles, speaking in full sentences. Back Exam:  Inspection: Unremarkable  Motion: Flexion 45 deg, Extension 45 deg, Side Bending to 45 deg bilaterally,  Rotation to 45 deg bilaterally  SLR laying: Negative  XSLR laying: Negative  Palpable tenderness: Bilateral SI joints. FABER: negative. Sensory change: Gross sensation intact to all lumbar and sacral dermatomes.  Reflexes: 2+ at both patellar tendons, 2+ at achilles tendons, Babinski's downgoing.  Strength at foot  Plantar-flexion: 5/5 Dorsi-flexion: 5/5 Eversion: 5/5 Inversion: 5/5  Leg strength  Quad: 5/5 Hamstring: 5/5 Hip flexor: 5/5 Hip abductors: 5/5  Gait unremarkable.  Impression and Recommendations:

## 2015-10-16 NOTE — Assessment & Plan Note (Signed)
Seems to be referable to the sacroiliac joints, and facets. X-rays, formal physical therapy, prednisone, meloxicam, return to see me in one month, MRI and consideration of sacroiliac joint injection if no better.

## 2015-10-17 ENCOUNTER — Other Ambulatory Visit: Payer: Self-pay | Admitting: Sports Medicine

## 2016-01-03 DIAGNOSIS — Z23 Encounter for immunization: Secondary | ICD-10-CM | POA: Diagnosis not present

## 2016-02-08 ENCOUNTER — Telehealth: Payer: Self-pay

## 2016-02-08 MED ORDER — CYCLOBENZAPRINE HCL 10 MG PO TABS: ORAL_TABLET | ORAL | Status: DC

## 2016-02-08 NOTE — Telephone Encounter (Signed)
Flexeril sent in

## 2016-02-08 NOTE — Telephone Encounter (Signed)
[facility] states she pulled her back a couple of days ago. She states she needs a muscle relaxer to help her until she can come in for office visit.

## 2016-02-11 ENCOUNTER — Ambulatory Visit (INDEPENDENT_AMBULATORY_CARE_PROVIDER_SITE_OTHER): Payer: No Typology Code available for payment source | Admitting: Family Medicine

## 2016-02-11 DIAGNOSIS — Z5329 Procedure and treatment not carried out because of patient's decision for other reasons: Secondary | ICD-10-CM

## 2016-02-11 NOTE — Progress Notes (Signed)
No show. Return as needed

## 2016-02-11 NOTE — Telephone Encounter (Signed)
Notified patient.

## 2016-02-12 DIAGNOSIS — R05 Cough: Secondary | ICD-10-CM | POA: Diagnosis not present

## 2016-02-12 DIAGNOSIS — J209 Acute bronchitis, unspecified: Secondary | ICD-10-CM | POA: Diagnosis not present

## 2016-03-06 ENCOUNTER — Other Ambulatory Visit: Payer: Self-pay | Admitting: Sports Medicine

## 2016-03-06 DIAGNOSIS — M47816 Spondylosis without myelopathy or radiculopathy, lumbar region: Secondary | ICD-10-CM | POA: Diagnosis not present

## 2016-03-06 DIAGNOSIS — J449 Chronic obstructive pulmonary disease, unspecified: Secondary | ICD-10-CM | POA: Diagnosis not present

## 2016-03-06 DIAGNOSIS — G8929 Other chronic pain: Secondary | ICD-10-CM | POA: Diagnosis not present

## 2016-03-06 DIAGNOSIS — I4891 Unspecified atrial fibrillation: Secondary | ICD-10-CM | POA: Diagnosis not present

## 2016-03-06 DIAGNOSIS — M5137 Other intervertebral disc degeneration, lumbosacral region: Secondary | ICD-10-CM | POA: Diagnosis not present

## 2016-03-06 DIAGNOSIS — M542 Cervicalgia: Secondary | ICD-10-CM | POA: Diagnosis not present

## 2016-03-06 DIAGNOSIS — M4806 Spinal stenosis, lumbar region: Secondary | ICD-10-CM | POA: Diagnosis not present

## 2016-03-06 DIAGNOSIS — I10 Essential (primary) hypertension: Secondary | ICD-10-CM | POA: Diagnosis not present

## 2016-03-06 DIAGNOSIS — Z888 Allergy status to other drugs, medicaments and biological substances status: Secondary | ICD-10-CM | POA: Diagnosis not present

## 2016-03-06 DIAGNOSIS — Z87891 Personal history of nicotine dependence: Secondary | ICD-10-CM | POA: Diagnosis not present

## 2016-04-10 DIAGNOSIS — M25562 Pain in left knee: Secondary | ICD-10-CM | POA: Diagnosis not present

## 2016-04-10 DIAGNOSIS — M25561 Pain in right knee: Secondary | ICD-10-CM | POA: Diagnosis not present

## 2016-04-10 DIAGNOSIS — M170 Bilateral primary osteoarthritis of knee: Secondary | ICD-10-CM | POA: Diagnosis not present

## 2016-04-25 ENCOUNTER — Ambulatory Visit: Payer: Medicare Other | Admitting: Sports Medicine

## 2016-04-28 ENCOUNTER — Ambulatory Visit (INDEPENDENT_AMBULATORY_CARE_PROVIDER_SITE_OTHER): Payer: Medicare Other | Admitting: Sports Medicine

## 2016-04-28 VITALS — BP 146/87 | HR 82 | Resp 18 | Wt 176.1 lb

## 2016-04-28 DIAGNOSIS — M1711 Unilateral primary osteoarthritis, right knee: Secondary | ICD-10-CM

## 2016-04-28 DIAGNOSIS — E669 Obesity, unspecified: Secondary | ICD-10-CM

## 2016-04-28 MED ORDER — ACETAMINOPHEN ER 650 MG PO TBCR: 1300.0000 mg | EXTENDED_RELEASE_TABLET | Freq: Three times a day (TID) | ORAL | Status: DC | PRN

## 2016-04-28 NOTE — Progress Notes (Signed)
  Subjective:    CC: right knee pain  HPI: This is a pleasant 70 year old female with known knee osteoarthritis. We finished a series of viscosupplementation 2 years ago. She's having a recurrence of pain, moderate, persistent and localized at the medial joint line without mechanical symptoms. Also having some pain on the left side but does not desire to proceed with intervention today.  Obesity: Has started the Atkins diet, agreeable to discuss this with nutrition.  Past medical history, Surgical history, Family history not pertinant except as noted below, Social history, Allergies, and medications have been entered into the medical record, reviewed, and no changes needed.   Review of Systems: No fevers, chills, night sweats, weight loss, chest pain, or shortness of breath.   Objective:    General: Well Developed, well nourished, and in no acute distress.  Neuro: Alert and oriented x3, extra-ocular muscles intact, sensation grossly intact.  HEENT: Normocephalic, atraumatic, pupils equal round reactive to light, neck supple, no masses, no lymphadenopathy, thyroid nonpalpable.  Skin: Warm and dry, no rashes. Cardiac: Regular rate and rhythm, no murmurs rubs or gallops, no lower extremity edema.  Respiratory: Clear to auscultation bilaterally. Not using accessory muscles, speaking in full sentences. Right Knee: Normal to inspection with no erythema or effusion or obvious bony abnormalities. Tender to palpation at the medial joint line ROM normal in flexion and extension and lower leg rotation. Ligaments with solid consistent endpoints including ACL, PCL, LCL, MCL. Negative Mcmurray's and provocative meniscal tests. Non painful patellar compression. Patellar and quadriceps tendons unremarkable. Hamstring and quadriceps strength is normal.  Procedure: Real-time Ultrasound Guided Injection of right knee Device: GE Logiq E  Verbal informed consent obtained.  Time-out conducted.  Noted no  overlying erythema, induration, or other signs of local infection.  Skin prepped in a sterile fashion.  Local anesthesia: Topical Ethyl chloride.  With sterile technique and under real time ultrasound guidance:  1 mL Kenalog 40, 2 mL lidocaine, 2 mL Marcaine injected easily, syringe switched and 30 mg/2 mL of OrthoVisc (sodium hyaluronate) in a prefilled syringe was injected easily into the knee through a 22-gauge needle. Completed without difficulty  Pain immediately resolved suggesting accurate placement of the medication.  Advised to call if fevers/chills, erythema, induration, drainage, or persistent bleeding.  Images permanently stored and available for review in the ultrasound unit.  Impression: Technically successful ultrasound guided injection.  Impression and Recommendations:

## 2016-04-28 NOTE — Assessment & Plan Note (Signed)
Adding nutritionist referral, she does have obesity and knee OA.

## 2016-04-28 NOTE — Assessment & Plan Note (Signed)
2 years since previous Visco supplementation. Steroid and Orthovisc injection number one, return in one week for #2. Referral for physical therapy/aquatic therapy.

## 2016-05-05 ENCOUNTER — Ambulatory Visit: Payer: No Typology Code available for payment source | Admitting: Sports Medicine

## 2016-05-08 DIAGNOSIS — R2681 Unsteadiness on feet: Secondary | ICD-10-CM | POA: Diagnosis not present

## 2016-05-08 DIAGNOSIS — M25551 Pain in right hip: Secondary | ICD-10-CM | POA: Diagnosis not present

## 2016-05-08 DIAGNOSIS — M545 Low back pain: Secondary | ICD-10-CM | POA: Diagnosis not present

## 2016-05-08 DIAGNOSIS — G8929 Other chronic pain: Secondary | ICD-10-CM | POA: Diagnosis not present

## 2016-05-08 DIAGNOSIS — M1711 Unilateral primary osteoarthritis, right knee: Secondary | ICD-10-CM | POA: Diagnosis not present

## 2016-05-08 DIAGNOSIS — Z7409 Other reduced mobility: Secondary | ICD-10-CM | POA: Diagnosis not present

## 2016-05-12 ENCOUNTER — Encounter: Payer: Self-pay | Admitting: Sports Medicine

## 2016-05-12 ENCOUNTER — Ambulatory Visit (INDEPENDENT_AMBULATORY_CARE_PROVIDER_SITE_OTHER): Payer: Medicare Other | Admitting: Sports Medicine

## 2016-05-12 VITALS — BP 149/88 | HR 76 | Resp 18 | Wt 175.0 lb

## 2016-05-12 DIAGNOSIS — M1711 Unilateral primary osteoarthritis, right knee: Secondary | ICD-10-CM

## 2016-05-12 DIAGNOSIS — R739 Hyperglycemia, unspecified: Secondary | ICD-10-CM | POA: Diagnosis not present

## 2016-05-12 DIAGNOSIS — E669 Obesity, unspecified: Secondary | ICD-10-CM

## 2016-05-12 NOTE — Assessment & Plan Note (Signed)
Patient declines nutritionist referral. Ordering routine blood work.  She will return fasting to get the blood work done. If insufficient weight loss at the follow-up visit we will consider weight loss medication.

## 2016-05-12 NOTE — Addendum Note (Signed)
Addended by: Elizabeth Sauer on: 05/12/2016 11:14 AM   Modules accepted: Orders, Medications

## 2016-05-12 NOTE — Progress Notes (Signed)

## 2016-05-12 NOTE — Assessment & Plan Note (Signed)
Orthovisc injection #2 of 4 into the right knee, she did miss her appointment last week. Return in one week for #3 of 4.

## 2016-05-13 DIAGNOSIS — G8929 Other chronic pain: Secondary | ICD-10-CM | POA: Diagnosis not present

## 2016-05-13 DIAGNOSIS — R2681 Unsteadiness on feet: Secondary | ICD-10-CM | POA: Diagnosis not present

## 2016-05-13 DIAGNOSIS — M545 Low back pain: Secondary | ICD-10-CM | POA: Diagnosis not present

## 2016-05-13 DIAGNOSIS — Z7409 Other reduced mobility: Secondary | ICD-10-CM | POA: Diagnosis not present

## 2016-05-13 DIAGNOSIS — M1711 Unilateral primary osteoarthritis, right knee: Secondary | ICD-10-CM | POA: Diagnosis not present

## 2016-05-13 DIAGNOSIS — M25551 Pain in right hip: Secondary | ICD-10-CM | POA: Diagnosis not present

## 2016-05-16 DIAGNOSIS — G8929 Other chronic pain: Secondary | ICD-10-CM | POA: Diagnosis not present

## 2016-05-16 DIAGNOSIS — R2681 Unsteadiness on feet: Secondary | ICD-10-CM | POA: Diagnosis not present

## 2016-05-16 DIAGNOSIS — Z7409 Other reduced mobility: Secondary | ICD-10-CM | POA: Diagnosis not present

## 2016-05-16 DIAGNOSIS — M545 Low back pain: Secondary | ICD-10-CM | POA: Diagnosis not present

## 2016-05-16 DIAGNOSIS — M1711 Unilateral primary osteoarthritis, right knee: Secondary | ICD-10-CM | POA: Diagnosis not present

## 2016-05-16 DIAGNOSIS — M25551 Pain in right hip: Secondary | ICD-10-CM | POA: Diagnosis not present

## 2016-05-19 ENCOUNTER — Ambulatory Visit: Payer: No Typology Code available for payment source | Admitting: Sports Medicine

## 2016-05-20 ENCOUNTER — Encounter: Payer: Self-pay | Admitting: Sports Medicine

## 2016-05-20 ENCOUNTER — Ambulatory Visit (INDEPENDENT_AMBULATORY_CARE_PROVIDER_SITE_OTHER): Payer: Medicare Other | Admitting: Sports Medicine

## 2016-05-20 VITALS — BP 133/73 | HR 69 | Resp 18 | Wt 176.5 lb

## 2016-05-20 DIAGNOSIS — Z299 Encounter for prophylactic measures, unspecified: Secondary | ICD-10-CM

## 2016-05-20 DIAGNOSIS — M1711 Unilateral primary osteoarthritis, right knee: Secondary | ICD-10-CM | POA: Diagnosis not present

## 2016-05-20 NOTE — Patient Instructions (Signed)
Dr. Silas [place].

## 2016-05-20 NOTE — Progress Notes (Signed)

## 2016-05-20 NOTE — Assessment & Plan Note (Signed)
Orthovisc injection #3 of 4 into the right knee, return in one week for #4 

## 2016-05-26 ENCOUNTER — Other Ambulatory Visit: Payer: Self-pay | Admitting: Sports Medicine

## 2016-05-27 ENCOUNTER — Ambulatory Visit: Payer: No Typology Code available for payment source | Admitting: Sports Medicine

## 2016-05-27 ENCOUNTER — Encounter: Payer: Self-pay | Admitting: Sports Medicine

## 2016-05-27 ENCOUNTER — Ambulatory Visit (INDEPENDENT_AMBULATORY_CARE_PROVIDER_SITE_OTHER): Payer: Medicare Other | Admitting: Sports Medicine

## 2016-05-27 DIAGNOSIS — M545 Low back pain, unspecified: Secondary | ICD-10-CM

## 2016-05-27 DIAGNOSIS — M1711 Unilateral primary osteoarthritis, right knee: Secondary | ICD-10-CM | POA: Diagnosis not present

## 2016-05-27 NOTE — Progress Notes (Addendum)
  Subjective:    CC:   HPI:  Past medical history, Surgical history, Family history not pertinant except as noted below, Social history, Allergies, and medications have been entered into the medical record, reviewed, and no changes needed.   Review of Systems: No fevers, chills, night sweats, weight loss, chest pain, or shortness of breath.   Objective:    General: Well Developed, well nourished, and in no acute distress.  Neuro: Alert and oriented x3, extra-ocular muscles intact, sensation grossly intact.  HEENT: Normocephalic, atraumatic, pupils equal round reactive to light, neck supple, no masses, no lymphadenopathy, thyroid nonpalpable.  Skin: Warm and dry, no rashes. Cardiac: Regular rate and rhythm, no murmurs rubs or gallops, no lower extremity edema.  Respiratory: Clear to auscultation bilaterally. Not using accessory muscles, speaking in full sentences.  Procedure: Real-time Ultrasound Guided Injection of right knee Device: GE Logiq E  Verbal informed consent obtained.  Time-out conducted.  Noted no overlying erythema, induration, or other signs of local infection.  Skin prepped in a sterile fashion.  Local anesthesia: Topical Ethyl chloride.  With sterile technique and under real time ultrasound guidance:   30 mg/2 mL of OrthoVisc (sodium hyaluronate) in a prefilled syringe was injected easily into the knee through a 22-gauge needle. Completed without difficulty  Pain immediately resolved suggesting accurate placement of the medication.  Advised to call if fevers/chills, erythema, induration, drainage, or persistent bleeding.  Images permanently stored and available for review in the ultrasound unit.  Impression: Technically successful ultrasound guided injection.  Impression and Recommendations:

## 2016-05-27 NOTE — Assessment & Plan Note (Signed)
Pain continues to be referable to the sacroiliac joints. Persistent pain despite physical therapy, I'm going to go ahead and proceed with MRI and consideration of SI joint injections afterwards.

## 2016-05-27 NOTE — Assessment & Plan Note (Signed)
Orthovisc injection #4 of 4 into the right knee, return as needed.

## 2016-05-27 NOTE — Addendum Note (Signed)
Addended by: Silverio Decamp on: 05/27/2016 04:15 PM   Modules accepted: Orders

## 2016-06-02 ENCOUNTER — Ambulatory Visit (INDEPENDENT_AMBULATORY_CARE_PROVIDER_SITE_OTHER): Payer: Medicare Other | Admitting: Obstetrics & Gynecology

## 2016-06-02 ENCOUNTER — Encounter: Payer: Self-pay | Admitting: Obstetrics & Gynecology

## 2016-06-02 VITALS — BP 128/72 | HR 57 | Wt 175.0 lb

## 2016-06-02 DIAGNOSIS — R635 Abnormal weight gain: Secondary | ICD-10-CM

## 2016-06-02 DIAGNOSIS — N3946 Mixed incontinence: Secondary | ICD-10-CM | POA: Diagnosis not present

## 2016-06-02 DIAGNOSIS — Z01419 Encounter for gynecological examination (general) (routine) without abnormal findings: Secondary | ICD-10-CM

## 2016-06-02 DIAGNOSIS — R5382 Chronic fatigue, unspecified: Secondary | ICD-10-CM

## 2016-06-02 NOTE — Progress Notes (Signed)
Subjective:    [facility] is a 70 y.o. MH P3 female who presents for an annual exam. She complains of a year's h/o urinary incontinence as well as a possible umbilical hernia. The patient is sexually active. GYN screening history: last pap: was normal. The patient wears seatbelts: yes. The patient participates in regular exercise: no. Has the patient ever been transfused or tattooed?: not asked. The patient reports that there is not domestic violence in her life.   Menstrual History: OB History    Gravida Para Term Preterm AB Living   3 3 3     3    SAB TAB Ectopic Multiple Live Births                  Menarche age: 12 No LMP recorded. Patient is postmenopausal.    The following portions of the patient's history were reviewed and updated as appropriate: allergies, current medications, past family history, past medical history, past social history, past surgical history and problem list.  Review of Systems Pertinent items are noted in HPI.    Objective:    BP 128/72 (BP Location: Right Arm, Patient Position: Sitting, Cuff Size: Large)   Pulse (!) 57   Wt 175 lb (79.4 kg)   BMI 30.04 kg/m   General Appearance:    Alert, cooperative, no distress, appears stated age  Head:    Normocephalic, without obvious abnormality, atraumatic  Eyes:    PERRL, conjunctiva/corneas clear, EOM's intact, fundi    benign, both eyes  Ears:    Normal TM's and external ear canals, both ears  Nose:   Nares normal, septum midline, mucosa normal, no drainage    or sinus tenderness  Throat:   Lips, mucosa, and tongue normal; teeth and gums normal  Neck:   Supple, symmetrical, trachea midline, no adenopathy;    thyroid:  no enlargement/tenderness/nodules; no carotid   bruit or JVD  Back:     Symmetric, no curvature, ROM normal, no CVA tenderness  Lungs:     Clear to auscultation bilaterally, respirations unlabored  Chest Wall:    No tenderness or deformity   Heart:    Regular rate and rhythm, S1  and S2 normal, no murmur, rub   or gallop  Breast Exam:    No tenderness, masses, or nipple abnormality  Abdomen:     Soft, non-tender, bowel sounds active all four quadrants,    no masses, no organomegaly, rectus diathesis  Genitalia:    Normal female without lesion, discharge or tenderness, obvious urine loss, no masses felt with bimanual exam     Extremities:   Extremities normal, atraumatic, no cyanosis or edema  Pulses:   2+ and symmetric all extremities  Skin:   Skin color, texture, turgor normal, no rashes or lesions  Lymph nodes:   Cervical, supraclavicular, and axillary nodes normal  Neurologic:   CNII-XII intact, normal strength, sensation and reflexes    throughout   .    Assessment:    Healthy female exam.    Plan:     Urology ref now   Gen surg ref prn She declines a mammogram at this time Fasting labs at her convenience

## 2016-06-03 ENCOUNTER — Other Ambulatory Visit: Payer: No Typology Code available for payment source | Admitting: *Deleted

## 2016-06-09 ENCOUNTER — Other Ambulatory Visit: Payer: No Typology Code available for payment source

## 2016-06-18 DIAGNOSIS — G894 Chronic pain syndrome: Secondary | ICD-10-CM | POA: Diagnosis not present

## 2016-06-18 DIAGNOSIS — M25561 Pain in right knee: Secondary | ICD-10-CM | POA: Diagnosis not present

## 2016-06-18 DIAGNOSIS — M545 Low back pain: Secondary | ICD-10-CM | POA: Diagnosis not present

## 2016-06-19 DIAGNOSIS — G894 Chronic pain syndrome: Secondary | ICD-10-CM | POA: Diagnosis not present

## 2016-06-19 DIAGNOSIS — M545 Low back pain: Secondary | ICD-10-CM | POA: Diagnosis not present

## 2016-06-19 DIAGNOSIS — M25561 Pain in right knee: Secondary | ICD-10-CM | POA: Diagnosis not present

## 2016-06-24 DIAGNOSIS — M25561 Pain in right knee: Secondary | ICD-10-CM | POA: Diagnosis not present

## 2016-06-24 DIAGNOSIS — G894 Chronic pain syndrome: Secondary | ICD-10-CM | POA: Diagnosis not present

## 2016-06-24 DIAGNOSIS — M545 Low back pain: Secondary | ICD-10-CM | POA: Diagnosis not present

## 2016-06-26 DIAGNOSIS — M25561 Pain in right knee: Secondary | ICD-10-CM | POA: Diagnosis not present

## 2016-06-26 DIAGNOSIS — G894 Chronic pain syndrome: Secondary | ICD-10-CM | POA: Diagnosis not present

## 2016-06-26 DIAGNOSIS — M545 Low back pain: Secondary | ICD-10-CM | POA: Diagnosis not present

## 2016-07-03 DIAGNOSIS — M25561 Pain in right knee: Secondary | ICD-10-CM | POA: Diagnosis not present

## 2016-07-03 DIAGNOSIS — G894 Chronic pain syndrome: Secondary | ICD-10-CM | POA: Diagnosis not present

## 2016-07-03 DIAGNOSIS — M545 Low back pain: Secondary | ICD-10-CM | POA: Diagnosis not present

## 2016-07-15 DIAGNOSIS — J441 Chronic obstructive pulmonary disease with (acute) exacerbation: Secondary | ICD-10-CM | POA: Diagnosis not present

## 2016-07-15 DIAGNOSIS — G4733 Obstructive sleep apnea (adult) (pediatric): Secondary | ICD-10-CM | POA: Diagnosis not present

## 2016-07-15 DIAGNOSIS — Z9989 Dependence on other enabling machines and devices: Secondary | ICD-10-CM | POA: Diagnosis not present

## 2016-07-15 DIAGNOSIS — R05 Cough: Secondary | ICD-10-CM | POA: Diagnosis not present

## 2016-07-25 ENCOUNTER — Other Ambulatory Visit: Payer: Self-pay

## 2016-07-25 DIAGNOSIS — M545 Low back pain, unspecified: Secondary | ICD-10-CM

## 2016-07-25 MED ORDER — MELOXICAM 15 MG PO TABS: ORAL_TABLET | ORAL | 3 refills | Status: DC

## 2016-08-29 ENCOUNTER — Telehealth: Payer: Self-pay | Admitting: *Deleted

## 2016-08-29 NOTE — Telephone Encounter (Signed)
Refill request for metoprolol. Patient has only established as an ortho patient and is a patient at [facility] family practice Dr. Jearld Lesch. Faxed request back to pharm with note to send to Dr. Roosvelt Harps office

## 2016-09-01 ENCOUNTER — Telehealth: Payer: Self-pay

## 2016-09-01 DIAGNOSIS — M545 Low back pain: Principal | ICD-10-CM

## 2016-09-01 DIAGNOSIS — G8929 Other chronic pain: Secondary | ICD-10-CM

## 2016-09-01 NOTE — Telephone Encounter (Signed)
Pt went on vacation during her previous physical therapy sessions and they have expired. Pt would like referral placed again to same facility in [place]. Please assist.

## 2016-09-01 NOTE — Telephone Encounter (Signed)
It seems as though she wanted to go to [facility] in [place] with [place]. Please clarify exactly which one she wanted to go to, we did a referral to physical therapy in [place] and she changed it.

## 2016-09-02 NOTE — Telephone Encounter (Signed)
Pt would like therapy in [place].

## 2016-09-03 ENCOUNTER — Other Ambulatory Visit: Payer: Self-pay | Admitting: Sports Medicine

## 2016-09-03 DIAGNOSIS — M545 Low back pain, unspecified: Secondary | ICD-10-CM

## 2016-09-03 NOTE — Telephone Encounter (Signed)
Pt states she would like to go to [facility] on [facility].

## 2016-09-04 MED ORDER — MELOXICAM 15 MG PO TABS: ORAL_TABLET | ORAL | 3 refills | Status: DC

## 2016-09-04 MED ORDER — LOSARTAN POTASSIUM-HCTZ 100-25 MG PO TABS: 1.0000 | ORAL_TABLET | Freq: Every day | ORAL | 1 refills | Status: AC

## 2016-09-04 MED ORDER — METOPROLOL SUCCINATE ER 25 MG PO TB24: 25.0000 mg | ORAL_TABLET | Freq: Every day | ORAL | 1 refills | Status: DC

## 2016-09-04 NOTE — Telephone Encounter (Signed)
New referral placed.

## 2016-09-04 NOTE — Addendum Note (Signed)
Addended by: Silverio Decamp on: 09/04/2016 02:12 PM   Modules accepted: Orders

## 2016-09-09 DIAGNOSIS — M545 Low back pain: Secondary | ICD-10-CM | POA: Diagnosis not present

## 2016-09-09 DIAGNOSIS — G894 Chronic pain syndrome: Secondary | ICD-10-CM | POA: Diagnosis not present

## 2016-09-09 DIAGNOSIS — M25561 Pain in right knee: Secondary | ICD-10-CM | POA: Diagnosis not present

## 2016-09-10 DIAGNOSIS — Z23 Encounter for immunization: Secondary | ICD-10-CM | POA: Diagnosis not present

## 2016-09-12 DIAGNOSIS — M545 Low back pain: Secondary | ICD-10-CM | POA: Diagnosis not present

## 2016-09-12 DIAGNOSIS — M25561 Pain in right knee: Secondary | ICD-10-CM | POA: Diagnosis not present

## 2016-09-12 DIAGNOSIS — G894 Chronic pain syndrome: Secondary | ICD-10-CM | POA: Diagnosis not present

## 2016-09-15 DIAGNOSIS — G894 Chronic pain syndrome: Secondary | ICD-10-CM | POA: Diagnosis not present

## 2016-09-15 DIAGNOSIS — M545 Low back pain: Secondary | ICD-10-CM | POA: Diagnosis not present

## 2016-09-15 DIAGNOSIS — M25561 Pain in right knee: Secondary | ICD-10-CM | POA: Diagnosis not present

## 2016-09-16 ENCOUNTER — Ambulatory Visit: Payer: No Typology Code available for payment source | Admitting: Sports Medicine

## 2016-09-16 ENCOUNTER — Encounter: Payer: Self-pay | Admitting: Sports Medicine

## 2016-09-17 DIAGNOSIS — G894 Chronic pain syndrome: Secondary | ICD-10-CM | POA: Diagnosis not present

## 2016-09-17 DIAGNOSIS — M545 Low back pain: Secondary | ICD-10-CM | POA: Diagnosis not present

## 2016-09-17 DIAGNOSIS — M25561 Pain in right knee: Secondary | ICD-10-CM | POA: Diagnosis not present

## 2016-09-19 DIAGNOSIS — G894 Chronic pain syndrome: Secondary | ICD-10-CM | POA: Diagnosis not present

## 2016-09-19 DIAGNOSIS — M25561 Pain in right knee: Secondary | ICD-10-CM | POA: Diagnosis not present

## 2016-09-19 DIAGNOSIS — M545 Low back pain: Secondary | ICD-10-CM | POA: Diagnosis not present

## 2016-12-23 DIAGNOSIS — I771 Stricture of artery: Secondary | ICD-10-CM | POA: Diagnosis not present

## 2016-12-23 DIAGNOSIS — J209 Acute bronchitis, unspecified: Secondary | ICD-10-CM | POA: Diagnosis not present

## 2016-12-23 DIAGNOSIS — J441 Chronic obstructive pulmonary disease with (acute) exacerbation: Secondary | ICD-10-CM | POA: Diagnosis not present

## 2016-12-23 DIAGNOSIS — I10 Essential (primary) hypertension: Secondary | ICD-10-CM | POA: Diagnosis not present

## 2016-12-23 DIAGNOSIS — J449 Chronic obstructive pulmonary disease, unspecified: Secondary | ICD-10-CM | POA: Diagnosis not present

## 2016-12-23 DIAGNOSIS — R05 Cough: Secondary | ICD-10-CM | POA: Diagnosis not present

## 2017-01-04 ENCOUNTER — Other Ambulatory Visit: Payer: Self-pay | Admitting: Sports Medicine

## 2017-01-05 ENCOUNTER — Encounter: Payer: Self-pay | Admitting: Emergency Medicine

## 2017-01-05 ENCOUNTER — Emergency Department (INDEPENDENT_AMBULATORY_CARE_PROVIDER_SITE_OTHER)
Admission: EM | Admit: 2017-01-05 | Discharge: 2017-01-05 | Disposition: A | Discharge status: Home / Self Care | Payer: Medicare Other | Source: Home / Self Care | Attending: Family Medicine | Admitting: Family Medicine

## 2017-01-05 DIAGNOSIS — H6693 Otitis media, unspecified, bilateral: Secondary | ICD-10-CM | POA: Diagnosis not present

## 2017-01-05 MED ORDER — NEOMYCIN-POLYMYXIN-HC 3.5-10000-1 OT SUSP: 4.0000 [drp] | Freq: Three times a day (TID) | OTIC | 0 refills | Status: DC

## 2017-01-05 MED ORDER — AMOXICILLIN-POT CLAVULANATE 875-125 MG PO TABS: 1.0000 | ORAL_TABLET | Freq: Two times a day (BID) | ORAL | 0 refills | Status: DC

## 2017-01-05 NOTE — ED Triage Notes (Signed)
Pt c/o right ear pain and pressure x1 week. States she just finished abx for URI x3 days ago.

## 2017-01-05 NOTE — ED Provider Notes (Signed)
CSN: [id number]     Arrival date & time 01/05/17  1422 History   First MD Initiated Contact with Patient 01/05/17 1441     Chief Complaint  Patient presents with  . Ear Fullness   (Consider location/radiation/quality/duration/timing/severity/associated sxs/prior Treatment) HPI [facility] is a 71 y.o. female presenting to UC with c/o 1 week of worsening ear pain and pressure in Right ear with associated nasal congestion, sore throat and minimal cough. Pt was treated with Prednisone and Levaquin for a URI, finished this medication course 3 days ago.  Denies fever, chills, n/v/d. Cough and chest congestion have nearly resolved but Right ear pain is worsening. Pt also requesting a refill of cortisporin ear drops. Pt notes she gest frequent Swimmer's Ear infections. She notes she does not feel like she has to use right now but is requesting as she may need it later.   Past Medical History:  Diagnosis Date  . Arthritis   . GERD (gastroesophageal reflux disease)   . High blood pressure   . Hypertension    History reviewed. No pertinent surgical history. Family History  Problem Relation Age of Onset  . Diabetes Father    Social History  Substance Use Topics  . Smoking status: Former Smoker    Quit date: 06/03/2011  . Smokeless tobacco: Never Used  . Alcohol use No   OB History    Gravida Para Term Preterm AB Living   3 3 3     3    SAB TAB Ectopic Multiple Live Births                 Review of Systems  Constitutional: Negative for chills and fever.  HENT: Positive for congestion, ear pain (Right), postnasal drip, rhinorrhea and sinus pressure. Negative for sinus pain, sore throat, trouble swallowing and voice change.   Respiratory: Positive for cough ( minimal). Negative for shortness of breath.   Cardiovascular: Negative for chest pain and palpitations.  Gastrointestinal: Negative for abdominal pain, diarrhea, nausea and vomiting.  Musculoskeletal: Negative for  arthralgias, back pain and myalgias.  Skin: Negative for rash.    Allergies  Ace inhibitors and Amlodipine  Home Medications   Prior to Admission medications   Medication Sig Start Date End Date Taking? Authorizing Provider  albuterol (PROVENTIL HFA;VENTOLIN HFA) 108 (90 Base) MCG/ACT inhaler Inhale into the lungs. 04/14/12   Historical Provider, MD  amoxicillin-clavulanate (AUGMENTIN) 875-125 MG tablet Take 1 tablet by mouth 2 (two) times daily. One po bid x 7 days 01/05/17   Noland Fordyce, PA-C  cyclobenzaprine (FLEXERIL) 10 MG tablet One half tab PO qHS, then increase gradually to one tab TID. Patient not taking: Reported on 06/02/2016 02/08/16   Silverio Decamp, MD  losartan-hydrochlorothiazide (HYZAAR) 100-25 MG tablet Take 1 tablet by mouth daily. 09/04/16   Silverio Decamp, MD  losartan-hydrochlorothiazide (HYZAAR) 100-25 MG tablet TAKE ONE TABLET BY MOUTH ONCE DAILY 01/05/17   Silverio Decamp, MD  meloxicam (MOBIC) 15 MG tablet One tab PO qAM with breakfast for 2 weeks, then daily prn pain. 09/04/16   Silverio Decamp, MD  metoprolol succinate (TOPROL-XL) 25 MG 24 hr tablet Take 1 tablet (25 mg total) by mouth daily. 09/04/16   Silverio Decamp, MD  neomycin-polymyxin-hydrocortisone (CORTISPORIN) 3.5-10000-1 otic suspension Place 4 drops into both ears 3 (three) times daily. For 7 days 01/05/17   Noland Fordyce, PA-C   Meds Ordered and Administered this Visit  Medications - No data to display  BP 112/80 (BP Location: Right Arm)   Pulse 106   Temp 97.8 F (36.6 C) (Oral)   Wt 175 lb (79.4 kg)   SpO2 95%   BMI 30.04 kg/m  No data found.   Physical Exam  Constitutional: She is oriented to person, place, and time. She appears well-developed and well-nourished.  HENT:  Head: Normocephalic and atraumatic.  Right Ear: Tympanic membrane is erythematous. Tympanic membrane is not bulging. A middle ear effusion is present.  Left Ear: Tympanic membrane is not  erythematous and not bulging. A middle ear effusion is present.  Nose: Mucosal edema present. Right sinus exhibits no maxillary sinus tenderness and no frontal sinus tenderness. Left sinus exhibits no maxillary sinus tenderness and no frontal sinus tenderness.  Mouth/Throat: Uvula is midline, oropharynx is clear and moist and mucous membranes are normal.  Eyes: EOM are normal.  Neck: Normal range of motion. Neck supple.  Cardiovascular: Normal rate and regular rhythm.   Pulmonary/Chest: Effort normal and breath sounds normal. No stridor. No respiratory distress. She has no wheezes. She has no rales.  Musculoskeletal: Normal range of motion.  Lymphadenopathy:    She has no cervical adenopathy.  Neurological: She is alert and oriented to person, place, and time.  Skin: Skin is warm and dry. She is not diaphoretic.  Psychiatric: She has a normal mood and affect. Her behavior is normal.  Nursing note and vitals reviewed.   Urgent Care Course     Procedures (including critical care time)  Labs Review Labs Reviewed - No data to display  Imaging Review No results found.  Tympanometry: Left and Right ear- Low peak height, Normal ear volume  MDM   1. Bilateral acute otitis media    Hx and exam c/w bilateral AOM Rx: Augmentin, refilled the cortisporin. Discussed when to use. F/u with PCP in 1 week if not improving.     Noland Fordyce, PA-C 01/05/17 1556

## 2017-01-08 ENCOUNTER — Telehealth: Payer: Self-pay

## 2017-01-08 NOTE — Telephone Encounter (Signed)
Spoke with patient, feeling better.  Was confused why her ear drops were not filled.  Spoke with Caryl Pina at the pharmacy and [employer] was not covering the drops.

## 2017-02-27 DIAGNOSIS — J449 Chronic obstructive pulmonary disease, unspecified: Secondary | ICD-10-CM | POA: Diagnosis not present

## 2017-02-27 DIAGNOSIS — Z6833 Body mass index (BMI) 33.0-33.9, adult: Secondary | ICD-10-CM | POA: Diagnosis not present

## 2017-02-27 DIAGNOSIS — G4733 Obstructive sleep apnea (adult) (pediatric): Secondary | ICD-10-CM | POA: Diagnosis not present

## 2017-02-27 DIAGNOSIS — M545 Low back pain: Secondary | ICD-10-CM | POA: Diagnosis not present

## 2017-02-27 DIAGNOSIS — Z2882 Immunization not carried out because of caregiver refusal: Secondary | ICD-10-CM | POA: Diagnosis not present

## 2017-02-27 DIAGNOSIS — N393 Stress incontinence (female) (male): Secondary | ICD-10-CM | POA: Diagnosis not present

## 2017-02-27 DIAGNOSIS — E669 Obesity, unspecified: Secondary | ICD-10-CM | POA: Diagnosis not present

## 2017-02-27 DIAGNOSIS — K429 Umbilical hernia without obstruction or gangrene: Secondary | ICD-10-CM | POA: Diagnosis not present

## 2017-02-27 DIAGNOSIS — I10 Essential (primary) hypertension: Secondary | ICD-10-CM | POA: Diagnosis not present

## 2017-02-27 DIAGNOSIS — Z0001 Encounter for general adult medical examination with abnormal findings: Secondary | ICD-10-CM | POA: Diagnosis not present

## 2017-02-27 DIAGNOSIS — R7303 Prediabetes: Secondary | ICD-10-CM | POA: Diagnosis not present

## 2017-03-12 DIAGNOSIS — Z0001 Encounter for general adult medical examination with abnormal findings: Secondary | ICD-10-CM | POA: Diagnosis not present

## 2017-03-12 DIAGNOSIS — H9390 Unspecified disorder of ear, unspecified ear: Secondary | ICD-10-CM | POA: Diagnosis not present

## 2017-03-12 DIAGNOSIS — E669 Obesity, unspecified: Secondary | ICD-10-CM | POA: Diagnosis not present

## 2017-03-12 DIAGNOSIS — I10 Essential (primary) hypertension: Secondary | ICD-10-CM | POA: Diagnosis not present

## 2017-03-12 DIAGNOSIS — Z0100 Encounter for examination of eyes and vision without abnormal findings: Secondary | ICD-10-CM | POA: Diagnosis not present

## 2017-03-12 DIAGNOSIS — M545 Low back pain: Secondary | ICD-10-CM | POA: Diagnosis not present

## 2017-03-12 DIAGNOSIS — Z6833 Body mass index (BMI) 33.0-33.9, adult: Secondary | ICD-10-CM | POA: Diagnosis not present

## 2017-03-12 DIAGNOSIS — G4733 Obstructive sleep apnea (adult) (pediatric): Secondary | ICD-10-CM | POA: Diagnosis not present

## 2017-03-12 DIAGNOSIS — Z13820 Encounter for screening for osteoporosis: Secondary | ICD-10-CM | POA: Diagnosis not present

## 2017-03-12 DIAGNOSIS — R202 Paresthesia of skin: Secondary | ICD-10-CM | POA: Diagnosis not present

## 2017-03-12 DIAGNOSIS — N393 Stress incontinence (female) (male): Secondary | ICD-10-CM | POA: Diagnosis not present

## 2017-03-12 DIAGNOSIS — J449 Chronic obstructive pulmonary disease, unspecified: Secondary | ICD-10-CM | POA: Diagnosis not present

## 2017-04-16 ENCOUNTER — Other Ambulatory Visit: Payer: Self-pay | Admitting: Sports Medicine

## 2017-04-30 ENCOUNTER — Other Ambulatory Visit: Payer: Self-pay | Admitting: Sports Medicine

## 2017-05-08 DIAGNOSIS — K219 Gastro-esophageal reflux disease without esophagitis: Secondary | ICD-10-CM | POA: Diagnosis not present

## 2017-05-08 DIAGNOSIS — I10 Essential (primary) hypertension: Secondary | ICD-10-CM | POA: Diagnosis not present

## 2017-05-08 DIAGNOSIS — Z86010 Personal history of colonic polyps: Secondary | ICD-10-CM | POA: Diagnosis not present

## 2017-06-02 DIAGNOSIS — R0602 Shortness of breath: Secondary | ICD-10-CM | POA: Diagnosis not present

## 2017-06-02 DIAGNOSIS — G4733 Obstructive sleep apnea (adult) (pediatric): Secondary | ICD-10-CM | POA: Diagnosis not present

## 2017-06-02 DIAGNOSIS — I10 Essential (primary) hypertension: Secondary | ICD-10-CM | POA: Diagnosis not present

## 2017-06-02 DIAGNOSIS — Z9989 Dependence on other enabling machines and devices: Secondary | ICD-10-CM | POA: Diagnosis not present

## 2017-06-02 DIAGNOSIS — J309 Allergic rhinitis, unspecified: Secondary | ICD-10-CM | POA: Diagnosis not present

## 2017-06-19 DIAGNOSIS — R0609 Other forms of dyspnea: Secondary | ICD-10-CM | POA: Diagnosis not present

## 2017-06-19 DIAGNOSIS — Z87891 Personal history of nicotine dependence: Secondary | ICD-10-CM | POA: Diagnosis not present

## 2017-06-19 DIAGNOSIS — I10 Essential (primary) hypertension: Secondary | ICD-10-CM | POA: Diagnosis not present

## 2017-06-19 DIAGNOSIS — R002 Palpitations: Secondary | ICD-10-CM | POA: Diagnosis not present

## 2017-07-09 DIAGNOSIS — R002 Palpitations: Secondary | ICD-10-CM | POA: Diagnosis not present

## 2017-07-16 DIAGNOSIS — I10 Essential (primary) hypertension: Secondary | ICD-10-CM | POA: Diagnosis not present

## 2017-07-16 DIAGNOSIS — R002 Palpitations: Secondary | ICD-10-CM | POA: Diagnosis not present

## 2017-08-03 DIAGNOSIS — I10 Essential (primary) hypertension: Secondary | ICD-10-CM | POA: Diagnosis not present

## 2017-08-03 DIAGNOSIS — I160 Hypertensive urgency: Secondary | ICD-10-CM | POA: Diagnosis not present

## 2017-08-03 DIAGNOSIS — Z87891 Personal history of nicotine dependence: Secondary | ICD-10-CM | POA: Diagnosis not present

## 2017-08-24 ENCOUNTER — Other Ambulatory Visit: Payer: Self-pay | Admitting: Sports Medicine

## 2017-08-24 DIAGNOSIS — M545 Low back pain, unspecified: Secondary | ICD-10-CM

## 2017-09-16 DIAGNOSIS — G4733 Obstructive sleep apnea (adult) (pediatric): Secondary | ICD-10-CM | POA: Diagnosis not present

## 2017-09-16 DIAGNOSIS — I10 Essential (primary) hypertension: Secondary | ICD-10-CM | POA: Diagnosis not present

## 2017-09-16 DIAGNOSIS — R062 Wheezing: Secondary | ICD-10-CM | POA: Diagnosis not present

## 2017-09-16 DIAGNOSIS — Z23 Encounter for immunization: Secondary | ICD-10-CM | POA: Diagnosis not present

## 2017-09-17 DIAGNOSIS — L738 Other specified follicular disorders: Secondary | ICD-10-CM | POA: Diagnosis not present

## 2017-09-17 DIAGNOSIS — R599 Enlarged lymph nodes, unspecified: Secondary | ICD-10-CM | POA: Diagnosis not present

## 2017-09-17 DIAGNOSIS — L720 Epidermal cyst: Secondary | ICD-10-CM | POA: Diagnosis not present

## 2017-10-07 DIAGNOSIS — M810 Age-related osteoporosis without current pathological fracture: Secondary | ICD-10-CM | POA: Diagnosis not present

## 2017-10-07 DIAGNOSIS — M85851 Other specified disorders of bone density and structure, right thigh: Secondary | ICD-10-CM | POA: Diagnosis not present

## 2017-10-07 DIAGNOSIS — Z1231 Encounter for screening mammogram for malignant neoplasm of breast: Secondary | ICD-10-CM | POA: Diagnosis not present

## 2017-10-15 DIAGNOSIS — N644 Mastodynia: Secondary | ICD-10-CM | POA: Diagnosis not present

## 2017-10-15 DIAGNOSIS — R1012 Left upper quadrant pain: Secondary | ICD-10-CM | POA: Diagnosis not present

## 2017-11-26 ENCOUNTER — Other Ambulatory Visit: Payer: Self-pay

## 2017-11-26 ENCOUNTER — Encounter: Payer: Self-pay | Admitting: Emergency Medicine

## 2017-11-26 ENCOUNTER — Emergency Department (INDEPENDENT_AMBULATORY_CARE_PROVIDER_SITE_OTHER)
Admission: EM | Admit: 2017-11-26 | Discharge: 2017-11-26 | Disposition: A | Discharge status: Home / Self Care | Payer: Medicare HMO | Source: Home / Self Care

## 2017-11-26 DIAGNOSIS — H1031 Unspecified acute conjunctivitis, right eye: Secondary | ICD-10-CM

## 2017-11-26 MED ORDER — OFLOXACIN 0.3 % OP SOLN: 1.0000 [drp] | Freq: Four times a day (QID) | OPHTHALMIC | 0 refills | Status: AC

## 2017-11-26 NOTE — ED Triage Notes (Signed)
Patient reports intermittent redness and discharge in right eye with some itching and burning for the past 5 days.

## 2017-11-26 NOTE — Discharge Instructions (Signed)
Use the ofloxacin eyedrops 1 drop in right eye 4 times daily.  I would advise using 1 drop in the left eye a couple of times daily also.  If you are not sure that you have hit the eye well with one drop, you can use a second drop if necessary.  See an eye specialist (optometrist or ophthalmologist) if you are not improving over the next several days or if you are getting worse at any time.  If necessary go to an emergency room on the weekend.  Practice good handwashing

## 2017-11-26 NOTE — ED Provider Notes (Signed)
[name]    CSN: [id number] Arrival date & time: 11/26/17  1559     History   Chief Complaint Chief Complaint  Patient presents with  . Conjunctivitis    right    HPI Cheryl Fitzpatrick is a 72 y.o. female.   HPI 72 year old lady who has a 4 or 5-day history of the right eye being red and itchy.  She had a lot of mattering in the eye today and can get her eyes open this morning.  Does not know of any exposure. Past Medical History:  Diagnosis Date  . Arthritis   . GERD (gastroesophageal reflux disease)   . High blood pressure   . Hypertension     Patient Active Problem List   Diagnosis Date Noted  . Preventive measure 05/20/2016  . Obesity 04/28/2016  . Low back pain syndrome 10/16/2015  . A-fib ([place]) 10/19/2014  . Acid reflux 10/19/2014  . HLD (hyperlipidemia) 10/19/2014  . Obstructive apnea 10/19/2014  . Abnormality of gait 10/19/2014  . Benign essential HTN 08/02/2014  . Abdominal pain, epigastric 04/25/2014  . Osteoarthritis of right knee 02/09/2014  . Avitaminosis D 10/31/2013  . CAFL (chronic airflow limitation) ([place]) 04/07/2013  . BP (high blood pressure) 10/22/2012    History reviewed. No pertinent surgical history.  OB History    Gravida Para Term Preterm AB Living   3 3 3     3    SAB TAB Ectopic Multiple Live Births                   Home Medications    Prior to Admission medications   Medication Sig Start Date End Date Taking? Authorizing Provider  albuterol (PROVENTIL HFA;VENTOLIN HFA) 108 (90 Base) MCG/ACT inhaler Inhale into the lungs. 04/14/12   [provider]  amoxicillin-clavulanate (AUGMENTIN) 875-125 MG tablet Take 1 tablet by mouth 2 (two) times daily. One po bid x 7 days 01/05/17   Noe Gens, PA-C  cyclobenzaprine (FLEXERIL) 10 MG tablet One half tab PO qHS, then increase gradually to one tab TID. Patient not taking: Reported on 06/02/2016 02/08/16   Silverio Decamp, MD    losartan-hydrochlorothiazide (HYZAAR) 100-25 MG tablet Take 1 tablet by mouth daily. 09/04/16   Silverio Decamp, MD  losartan-hydrochlorothiazide (HYZAAR) 100-25 MG tablet TAKE ONE TABLET BY MOUTH ONCE DAILY 01/05/17   Silverio Decamp, MD  meloxicam (MOBIC) 15 MG tablet TAKE ONE TABLET BY MOUTH ONCE DAILY IN THE MORNING WITH BREAKFAST FOR 2 WEEKS, THEN DAILY AS NEEDED FOR PAIN 08/24/17   Silverio Decamp, MD  metoprolol succinate (TOPROL-XL) 25 MG 24 hr tablet TAKE ONE TABLET BY MOUTH ONCE DAILY 04/30/17   Silverio Decamp, MD  neomycin-polymyxin-hydrocortisone (CORTISPORIN) 3.5-10000-1 otic suspension Place 4 drops into both ears 3 (three) times daily. For 7 days 01/05/17   Noe Gens, PA-C  ofloxacin (OCUFLOX) 0.3 % ophthalmic solution Place 1 drop into the right eye 4 (four) times daily. 11/26/17   Posey Boyer, MD    Family History Family History  Problem Relation Age of Onset  . Diabetes Father     Social History Social History   Tobacco Use  . Smoking status: Former Smoker    Last attempt to quit: 06/03/2011    Years since quitting: 6.4  . Smokeless tobacco: Never Used  Substance Use Topics  . Alcohol use: No  . Drug use: No     Allergies   Ace inhibitors and  Amlodipine   Review of Systems Review of Systems Constitutional is unremarkable HEENT unremarkable except for the eyes noted above.  She has not had any URI symptoms. Physical Exam Triage Vital Signs ED Triage Vitals  Enc Vitals Group     BP 11/26/17 1628 114/73     Pulse Rate 11/26/17 1628 68     Resp 11/26/17 1628 16     Temp 11/26/17 1628 98 F (36.7 C)     Temp Source 11/26/17 1628 Oral     SpO2 11/26/17 1628 97 %     Weight 11/26/17 1629 180 lb (81.6 kg)     Height 11/26/17 1629 5\' 3"  (1.6 m)     Head Circumference --      Peak Flow --      Pain Score 11/26/17 1629 1     Pain Loc --      Pain Edu? --      Excl. in [place]? --    No data found.  Updated Vital Signs BP  114/73 (BP Location: Right Arm)   Pulse 68   Temp 98 F (36.7 C) (Oral)   Resp 16   Ht 5\' 3"  (1.6 m)   Wt 180 lb (81.6 kg)   SpO2 97%   BMI 31.89 kg/m   Visual Acuity Right Eye Distance: 20/25 Left Eye Distance: 20/25 Bilateral Distance: without correction  Right Eye Near:   Left Eye Near:    Bilateral Near:     Physical Exam Healthy-appearing elderly lady.  TMs normal.  Throat clear.  Sinuses nontender.  Her left eye appears fine.  The right eye has injected conjunctiva.  She says the eyelid was swollen though it is not at this time.  She has mild crusting along the lid.  EOMs intact.  Visual fields normal.  Fundi appear benign.  Eyes is not terribly tender.  UC Treatments / Results  Labs (all labs ordered are listed, but only abnormal results are displayed) Labs Reviewed - No data to display  EKG  EKG Interpretation None       Radiology No results found.  Procedures Procedures (including critical care time)  Medications Ordered in UC Medications - No data to display   Initial Impression / Assessment and Plan / UC Course  I have reviewed the triage vital signs and the nursing notes.  Pertinent labs & imaging results that were available during my care of the patient were reviewed by me and considered in my medical decision making (see chart for details).     Conjunctivitis, etiology unknown.  Explained to her it could be bacteria, could be a virus.  Final Clinical Impressions(s) / UC Diagnoses   Final diagnoses:  Acute conjunctivitis of right eye, unspecified acute conjunctivitis type    ED Discharge Orders        Ordered    ofloxacin (OCUFLOX) 0.3 % ophthalmic solution  4 times daily     11/26/17 1712       Controlled Substance Prescriptions [place] Controlled Substance Registry consulted? Not Applicable   Posey Boyer, MD 11/26/17 [phone]

## 2018-01-01 ENCOUNTER — Emergency Department
Admission: EM | Admit: 2018-01-01 | Discharge: 2018-01-01 | Disposition: A | Discharge status: Home / Self Care | Payer: Medicare HMO | Source: Home / Self Care | Attending: Family Medicine | Admitting: Family Medicine

## 2018-01-01 ENCOUNTER — Other Ambulatory Visit: Payer: Self-pay

## 2018-01-01 ENCOUNTER — Encounter: Payer: Self-pay | Admitting: Emergency Medicine

## 2018-01-01 DIAGNOSIS — J069 Acute upper respiratory infection, unspecified: Secondary | ICD-10-CM | POA: Diagnosis not present

## 2018-01-01 DIAGNOSIS — B9789 Other viral agents as the cause of diseases classified elsewhere: Secondary | ICD-10-CM | POA: Diagnosis not present

## 2018-01-01 MED ORDER — PREDNISONE 20 MG PO TABS: ORAL_TABLET | ORAL | 0 refills | Status: AC

## 2018-01-01 MED ORDER — METHYLPREDNISOLONE SODIUM SUCC 125 MG IJ SOLR
80.0000 mg | Freq: Once | INTRAMUSCULAR | Status: AC
  Administered 2018-01-01: 80 mg via INTRAMUSCULAR

## 2018-01-01 MED ORDER — BENZONATATE 200 MG PO CAPS: ORAL_CAPSULE | ORAL | 0 refills | Status: AC

## 2018-01-01 MED ORDER — AZITHROMYCIN 250 MG PO TABS: ORAL_TABLET | ORAL | 0 refills | Status: AC

## 2018-01-01 NOTE — ED Provider Notes (Signed)
[facility]    CSN: [id number] Arrival date & time: 01/01/18  1430     History   Chief Complaint Chief Complaint  Patient presents with  . Cough    HPI Cheryl Fitzpatrick is a 72 y.o. female.   Patient developed a dry cough yesterday.  She has now developed mild sore throat and sinus congestion.  No fevers, chills, and sweats.  She has developed some wheezing but no pleuritic pain or shortness of breath. She has a history of OSA and possible underlying asthma, followed by the [facility].   The history is provided by the patient.    Past Medical History:  Diagnosis Date  . Arthritis   . GERD (gastroesophageal reflux disease)   . High blood pressure   . Hypertension     Patient Active Problem List   Diagnosis Date Noted  . Preventive measure 05/20/2016  . Obesity 04/28/2016  . Low back pain syndrome 10/16/2015  . A-fib ([place]) 10/19/2014  . Acid reflux 10/19/2014  . HLD (hyperlipidemia) 10/19/2014  . Obstructive apnea 10/19/2014  . Abnormality of gait 10/19/2014  . Benign essential HTN 08/02/2014  . Abdominal pain, epigastric 04/25/2014  . Osteoarthritis of right knee 02/09/2014  . Avitaminosis D 10/31/2013  . CAFL (chronic airflow limitation) ([place]) 04/07/2013  . BP (high blood pressure) 10/22/2012    History reviewed. No pertinent surgical history.  OB History    Gravida Para Term Preterm AB Living   3 3 3     3    SAB TAB Ectopic Multiple Live Births                   Home Medications    Prior to Admission medications   Medication Sig Start Date End Date Taking? Authorizing Provider  albuterol (PROVENTIL HFA;VENTOLIN HFA) 108 (90 Base) MCG/ACT inhaler Inhale into the lungs. 04/14/12   [provider]  azithromycin (ZITHROMAX Z-PAK) 250 MG tablet Take 2 tabs today; then begin one tab once daily for 4 more days. 01/01/18   Kandra Nicolas, MD  benzonatate (TESSALON) 200 MG capsule Take one cap by mouth at  bedtime as needed for cough.  May repeat in 4 to 6 hours 01/01/18   Kandra Nicolas, MD  losartan-hydrochlorothiazide (HYZAAR) 100-25 MG tablet Take 1 tablet by mouth daily. 09/04/16   Silverio Decamp, MD  losartan-hydrochlorothiazide (HYZAAR) 100-25 MG tablet TAKE ONE TABLET BY MOUTH ONCE DAILY 01/05/17   Silverio Decamp, MD  meloxicam (MOBIC) 15 MG tablet TAKE ONE TABLET BY MOUTH ONCE DAILY IN THE MORNING WITH BREAKFAST FOR 2 WEEKS, THEN DAILY AS NEEDED FOR PAIN 08/24/17   Silverio Decamp, MD  metoprolol succinate (TOPROL-XL) 25 MG 24 hr tablet TAKE ONE TABLET BY MOUTH ONCE DAILY 04/30/17   Silverio Decamp, MD  ofloxacin (OCUFLOX) 0.3 % ophthalmic solution Place 1 drop into the right eye 4 (four) times daily. 11/26/17   Posey Boyer, MD  predniSONE (DELTASONE) 20 MG tablet Take one tab by mouth twice daily for 4 days, then one daily for 3 days. Take with food. 01/01/18   Kandra Nicolas, MD    Family History Family History  Problem Relation Age of Onset  . Diabetes Father     Social History Social History   Tobacco Use  . Smoking status: Former Smoker    Last attempt to quit: 06/03/2011    Years since quitting: 6.5  . Smokeless tobacco:  Never Used  Substance Use Topics  . Alcohol use: No  . Drug use: No     Allergies   Ace inhibitors and Amlodipine   Review of Systems Review of Systems + sore throat + cough No pleuritic pain No wheezing + nasal congestion + post-nasal drainage No sinus pain/pressure No itchy/red eyes ? earache No hemoptysis No SOB No fever, + chills No nausea No vomiting No abdominal pain No diarrhea No urinary symptoms No skin rash + fatigue No myalgias + headache Used OTC meds without relief   Physical Exam Triage Vital Signs ED Triage Vitals [01/01/18 1509]  Enc Vitals Group     BP (!) 159/85     Pulse Rate 73     Resp      Temp 98 F (36.7 C)     Temp Source Oral     SpO2 96 %     Weight      Height        Head Circumference      Peak Flow      Pain Score      Pain Loc      Pain Edu?      Excl. in [place]?    No data found.  Updated Vital Signs BP (!) 159/85 (BP Location: Right Arm)   Pulse 73   Temp 98 F (36.7 C) (Oral)   SpO2 96%   Visual Acuity Right Eye Distance:   Left Eye Distance:   Bilateral Distance:    Right Eye Near:   Left Eye Near:    Bilateral Near:     Physical Exam Nursing notes and Vital Signs reviewed. Appearance:  Patient appears stated age, and in no acute distress Eyes:  Pupils are equal, round, and reactive to light and accomodation.  Extraocular movement is intact.  Conjunctivae are not inflamed  Ears:  Canals normal.  Tympanic membranes normal.  Nose:  Mildly congested turbinates.  No sinus tenderness. Pharynx:  Normal Neck:  Supple.  Enlarged posterior/lateral nodes are palpated bilaterally, tender to palpation on the left.   Lungs:  Clear to auscultation.  Breath sounds are equal.  Moving air well. Heart:  Regular rate and rhythm without murmurs, rubs, or gallops.  Abdomen:  Nontender without masses or hepatosplenomegaly.  Bowel sounds are present.  No CVA or flank tenderness.  Extremities:  No edema.  Skin:  No rash present.    UC Treatments / Results  Labs (all labs ordered are listed, but only abnormal results are displayed) Labs Reviewed - No data to display  EKG  EKG Interpretation None       Radiology No results found.  Procedures Procedures (including critical care time)  Medications Ordered in UC Medications  methylPREDNISolone sodium succinate (SOLU-MEDROL) 125 mg/2 mL injection 80 mg (not administered)     Initial Impression / Assessment and Plan / UC Course  I have reviewed the triage vital signs and the nursing notes.  Pertinent labs & imaging results that were available during my care of the patient were reviewed by me and considered in my medical decision making (see chart for details).    Begin Z-pak for  atypical coverage.  Administered Solumedrol 80mg  IM. Prescription written for Benzonatate [facility]) to take at bedtime for night-time cough.  Begin prednisone burst/taper.Saturday 01/02/18. Take plain guaifenesin (1200mg  extended release tabs such as Mucinex) twice daily, with plenty of water, for cough and congestion.  Get adequate rest.   May use Afrin nasal spray (or  generic oxymetazoline) each morning for about 5 days and then discontinue.  Also recommend using saline nasal spray several times daily and saline nasal irrigation (AYR is a common brand).    Try warm salt water gargles for sore throat.  Stop all antihistamines for now, and other non-prescription cough/cold preparations. Followup with Family Doctor if not improved in one week.     Final Clinical Impressions(s) / UC Diagnoses   Final diagnoses:  Viral URI with cough    ED Discharge Orders        Ordered    azithromycin (ZITHROMAX Z-PAK) 250 MG tablet     01/01/18 1546    predniSONE (DELTASONE) 20 MG tablet     01/01/18 1546    benzonatate (TESSALON) 200 MG capsule     01/01/18 1546           Kandra Nicolas, MD 01/09/18 1312

## 2018-01-01 NOTE — ED Triage Notes (Signed)
Dry cough started yesterday

## 2018-01-01 NOTE — Discharge Instructions (Signed)
Begin prednisone Saturday 01/02/18. Take plain guaifenesin (1200mg  extended release tabs such as Mucinex) twice daily, with plenty of water, for cough and congestion.  Get adequate rest.   May use Afrin nasal spray (or generic oxymetazoline) each morning for about 5 days and then discontinue.  Also recommend using saline nasal spray several times daily and saline nasal irrigation (AYR is a common brand).    Try warm salt water gargles for sore throat.  Stop all antihistamines for now, and other non-prescription cough/cold preparations.

## 2019-05-24 ENCOUNTER — Telehealth: Payer: Self-pay | Admitting: Sports Medicine

## 2019-05-24 NOTE — Telephone Encounter (Signed)
Pt called and requested an appt with Dr.T,  I see she was discharged in 2017 for NO SHOWS and pt acted as if she was not aware. Patient Apologized and states she was taking care of her sick mother and would like to know IF Dr.T will see her for her left knee pain this week? Call patient back to advise

## 2019-05-24 NOTE — Telephone Encounter (Signed)
It might be reasonable to have Dr. Georgina Snell see her for this.  She has been discharged from my practice.

## 2019-05-25 NOTE — Telephone Encounter (Signed)
I am sorry but she has been dismissed from my practice, she may try Dr. Clearance Coots at the sports medicine center in [facility].

## 2019-05-25 NOTE — Telephone Encounter (Signed)
Pt was needing this week due to the knee pain and Dr.Corey is off
